# Patient Record
Sex: Female | Born: 1970 | Race: Black or African American | Hispanic: No | Marital: Single | Smoking: Never smoker
Health system: Southern US, Community
[De-identification: ages and names within clinical notes are randomized; demographics above are authoritative.]

## PROBLEM LIST (undated history)

## (undated) DIAGNOSIS — R509 Fever, unspecified: Secondary | ICD-10-CM

## (undated) DIAGNOSIS — J9601 Acute respiratory failure with hypoxia: Secondary | ICD-10-CM

## (undated) DIAGNOSIS — I428 Other cardiomyopathies: Secondary | ICD-10-CM

## (undated) DIAGNOSIS — I10 Essential (primary) hypertension: Secondary | ICD-10-CM

## (undated) DIAGNOSIS — N183 Chronic kidney disease, stage 3 (moderate): Secondary | ICD-10-CM

## (undated) DIAGNOSIS — I5041 Acute combined systolic (congestive) and diastolic (congestive) heart failure: Secondary | ICD-10-CM

## (undated) DIAGNOSIS — J9600 Acute respiratory failure, unspecified whether with hypoxia or hypercapnia: Secondary | ICD-10-CM

## (undated) DIAGNOSIS — R739 Hyperglycemia, unspecified: Secondary | ICD-10-CM

## (undated) DIAGNOSIS — N179 Acute kidney failure, unspecified: Secondary | ICD-10-CM

## (undated) DIAGNOSIS — I5042 Chronic combined systolic (congestive) and diastolic (congestive) heart failure: Secondary | ICD-10-CM

## (undated) DIAGNOSIS — F419 Anxiety disorder, unspecified: Secondary | ICD-10-CM

## (undated) DIAGNOSIS — I160 Hypertensive urgency: Secondary | ICD-10-CM

## (undated) DIAGNOSIS — J810 Acute pulmonary edema: Secondary | ICD-10-CM

## (undated) DIAGNOSIS — M797 Fibromyalgia: Secondary | ICD-10-CM

## (undated) HISTORY — DX: Chronic kidney disease, stage 3 (moderate): N18.3

## (undated) HISTORY — DX: Acute combined systolic (congestive) and diastolic (congestive) heart failure: I50.41

## (undated) HISTORY — DX: Fever, unspecified: R50.9

## (undated) HISTORY — DX: Fibromyalgia: M79.7

## (undated) HISTORY — DX: Other cardiomyopathies: I42.8

## (undated) HISTORY — DX: Hyperglycemia, unspecified: R73.9

## (undated) HISTORY — DX: Acute respiratory failure, unspecified whether with hypoxia or hypercapnia: J96.00

## (undated) HISTORY — DX: Acute pulmonary edema: J81.0

## (undated) HISTORY — DX: Acute respiratory failure with hypoxia: J96.01

## (undated) HISTORY — DX: Hypertensive urgency: I16.0

## (undated) HISTORY — DX: Chronic combined systolic (congestive) and diastolic (congestive) heart failure: I50.42

## (undated) HISTORY — DX: Acute kidney failure, unspecified: N17.9

## (undated) HISTORY — DX: Essential (primary) hypertension: I10

## (undated) HISTORY — DX: Anxiety disorder, unspecified: F41.9

---

## 1998-10-17 ENCOUNTER — Emergency Department (HOSPITAL_COMMUNITY): Admission: EM | Admit: 1998-10-17 | Discharge: 1998-10-17 | Payer: Self-pay | Admitting: Family Medicine

## 1998-10-19 ENCOUNTER — Encounter: Admission: RE | Admit: 1998-10-19 | Discharge: 1999-01-17 | Payer: Self-pay | Admitting: *Deleted

## 1998-10-23 ENCOUNTER — Encounter: Admission: RE | Admit: 1998-10-23 | Discharge: 1998-12-06 | Payer: Self-pay | Admitting: *Deleted

## 1998-10-28 ENCOUNTER — Encounter: Payer: Self-pay | Admitting: Emergency Medicine

## 1998-10-28 ENCOUNTER — Emergency Department (HOSPITAL_COMMUNITY): Admission: EM | Admit: 1998-10-28 | Discharge: 1998-10-28 | Payer: Self-pay | Admitting: Emergency Medicine

## 1998-10-31 ENCOUNTER — Encounter: Payer: Self-pay | Admitting: *Deleted

## 1998-10-31 ENCOUNTER — Ambulatory Visit (HOSPITAL_COMMUNITY): Admission: RE | Admit: 1998-10-31 | Discharge: 1998-10-31 | Payer: Self-pay | Admitting: *Deleted

## 1999-04-23 ENCOUNTER — Encounter: Payer: Self-pay | Admitting: Family Medicine

## 1999-04-23 ENCOUNTER — Ambulatory Visit (HOSPITAL_COMMUNITY): Admission: RE | Admit: 1999-04-23 | Discharge: 1999-04-23 | Payer: Self-pay | Admitting: Family Medicine

## 2017-05-27 ENCOUNTER — Encounter (HOSPITAL_COMMUNITY): Payer: Self-pay | Admitting: Emergency Medicine

## 2017-05-27 ENCOUNTER — Inpatient Hospital Stay (HOSPITAL_COMMUNITY)
Admission: EM | Admit: 2017-05-27 | Discharge: 2017-06-02 | DRG: 286 | Disposition: A | Discharge status: Home / Self Care | Payer: Self-pay | Attending: Internal Medicine | Admitting: Internal Medicine

## 2017-05-27 ENCOUNTER — Emergency Department (HOSPITAL_COMMUNITY): Payer: Self-pay

## 2017-05-27 DIAGNOSIS — J810 Acute pulmonary edema: Secondary | ICD-10-CM | POA: Diagnosis present

## 2017-05-27 DIAGNOSIS — J9600 Acute respiratory failure, unspecified whether with hypoxia or hypercapnia: Secondary | ICD-10-CM

## 2017-05-27 DIAGNOSIS — R509 Fever, unspecified: Secondary | ICD-10-CM

## 2017-05-27 DIAGNOSIS — E876 Hypokalemia: Secondary | ICD-10-CM | POA: Diagnosis present

## 2017-05-27 DIAGNOSIS — N179 Acute kidney failure, unspecified: Secondary | ICD-10-CM | POA: Diagnosis present

## 2017-05-27 DIAGNOSIS — J9601 Acute respiratory failure with hypoxia: Secondary | ICD-10-CM | POA: Diagnosis present

## 2017-05-27 DIAGNOSIS — I160 Hypertensive urgency: Secondary | ICD-10-CM | POA: Diagnosis present

## 2017-05-27 DIAGNOSIS — R0603 Acute respiratory distress: Secondary | ICD-10-CM

## 2017-05-27 DIAGNOSIS — I447 Left bundle-branch block, unspecified: Secondary | ICD-10-CM | POA: Diagnosis present

## 2017-05-27 DIAGNOSIS — Z885 Allergy status to narcotic agent status: Secondary | ICD-10-CM

## 2017-05-27 DIAGNOSIS — I161 Hypertensive emergency: Secondary | ICD-10-CM | POA: Diagnosis present

## 2017-05-27 DIAGNOSIS — I429 Cardiomyopathy, unspecified: Secondary | ICD-10-CM | POA: Diagnosis present

## 2017-05-27 DIAGNOSIS — I110 Hypertensive heart disease with heart failure: Principal | ICD-10-CM | POA: Diagnosis present

## 2017-05-27 DIAGNOSIS — I5041 Acute combined systolic (congestive) and diastolic (congestive) heart failure: Secondary | ICD-10-CM | POA: Diagnosis present

## 2017-05-27 DIAGNOSIS — W19XXXA Unspecified fall, initial encounter: Secondary | ICD-10-CM

## 2017-05-27 DIAGNOSIS — Z8249 Family history of ischemic heart disease and other diseases of the circulatory system: Secondary | ICD-10-CM

## 2017-05-27 DIAGNOSIS — R739 Hyperglycemia, unspecified: Secondary | ICD-10-CM

## 2017-05-27 DIAGNOSIS — E1165 Type 2 diabetes mellitus with hyperglycemia: Secondary | ICD-10-CM | POA: Diagnosis present

## 2017-05-27 DIAGNOSIS — Z79899 Other long term (current) drug therapy: Secondary | ICD-10-CM

## 2017-05-27 DIAGNOSIS — E872 Acidosis: Secondary | ICD-10-CM | POA: Diagnosis present

## 2017-05-27 DIAGNOSIS — R000 Tachycardia, unspecified: Secondary | ICD-10-CM | POA: Diagnosis present

## 2017-05-27 DIAGNOSIS — D649 Anemia, unspecified: Secondary | ICD-10-CM | POA: Diagnosis present

## 2017-05-27 DIAGNOSIS — J189 Pneumonia, unspecified organism: Secondary | ICD-10-CM | POA: Diagnosis present

## 2017-05-27 DIAGNOSIS — F419 Anxiety disorder, unspecified: Secondary | ICD-10-CM | POA: Diagnosis present

## 2017-05-27 DIAGNOSIS — Z833 Family history of diabetes mellitus: Secondary | ICD-10-CM

## 2017-05-27 HISTORY — DX: Acute respiratory failure with hypoxia: J96.01

## 2017-05-27 HISTORY — DX: Hypertensive urgency: I16.0

## 2017-05-27 HISTORY — DX: Essential (primary) hypertension: I10

## 2017-05-27 HISTORY — DX: Anxiety disorder, unspecified: F41.9

## 2017-05-27 HISTORY — DX: Acute kidney failure, unspecified: N17.9

## 2017-05-27 HISTORY — DX: Fever, unspecified: R50.9

## 2017-05-27 HISTORY — DX: Hyperglycemia, unspecified: R73.9

## 2017-05-27 HISTORY — DX: Acute pulmonary edema: J81.0

## 2017-05-27 HISTORY — DX: Acute respiratory failure, unspecified whether with hypoxia or hypercapnia: J96.00

## 2017-05-27 LAB — COMPREHENSIVE METABOLIC PANEL
ALBUMIN: 3.3 g/dL — AB (ref 3.5–5.0)
ALT: 51 U/L (ref 14–54)
ANION GAP: 16 — AB (ref 5–15)
AST: 55 U/L — AB (ref 15–41)
Alkaline Phosphatase: 83 U/L (ref 38–126)
BUN: 10 mg/dL (ref 6–20)
CHLORIDE: 105 mmol/L (ref 101–111)
CO2: 16 mmol/L — AB (ref 22–32)
Calcium: 8.6 mg/dL — ABNORMAL LOW (ref 8.9–10.3)
Creatinine, Ser: 1.63 mg/dL — ABNORMAL HIGH (ref 0.44–1.00)
GFR calc Af Amer: 42 mL/min — ABNORMAL LOW (ref >60)
GFR calc non Af Amer: 36 mL/min — ABNORMAL LOW (ref >60)
GLUCOSE: 337 mg/dL — AB (ref 65–99)
POTASSIUM: 3.7 mmol/L (ref 3.5–5.1)
SODIUM: 137 mmol/L (ref 135–145)
Total Bilirubin: 1.3 mg/dL — ABNORMAL HIGH (ref 0.3–1.2)
Total Protein: 6.1 g/dL — ABNORMAL LOW (ref 6.5–8.1)

## 2017-05-27 LAB — CBC WITH DIFFERENTIAL/PLATELET
BASOS ABS: 0 10*3/uL (ref 0.0–0.1)
Basophils Relative: 0 %
EOS ABS: 0.3 10*3/uL (ref 0.0–0.7)
Eosinophils Relative: 2 %
HCT: 36.1 % (ref 36.0–46.0)
Hemoglobin: 11.5 g/dL — ABNORMAL LOW (ref 12.0–15.0)
LYMPHS ABS: 6.8 10*3/uL — AB (ref 0.7–4.0)
Lymphocytes Relative: 40 %
MCH: 29.1 pg (ref 26.0–34.0)
MCHC: 31.9 g/dL (ref 30.0–36.0)
MCV: 91.4 fL (ref 78.0–100.0)
MONO ABS: 0.7 10*3/uL (ref 0.1–1.0)
Monocytes Relative: 4 %
NEUTROS ABS: 9.1 10*3/uL — AB (ref 1.7–7.7)
Neutrophils Relative %: 54 %
PLATELETS: 460 10*3/uL — AB (ref 150–400)
RBC: 3.95 MIL/uL (ref 3.87–5.11)
RDW: 14.1 % (ref 11.5–15.5)
WBC: 16.9 10*3/uL — AB (ref 4.0–10.5)

## 2017-05-27 LAB — I-STAT BETA HCG BLOOD, ED (MC, WL, AP ONLY): I-stat hCG, quantitative: <5 m[IU]/mL (ref <5)

## 2017-05-27 LAB — URINALYSIS, ROUTINE W REFLEX MICROSCOPIC
Bilirubin Urine: NEGATIVE
GLUCOSE, UA: 150 mg/dL — AB
Ketones, ur: NEGATIVE mg/dL
LEUKOCYTES UA: NEGATIVE
NITRITE: NEGATIVE
Protein, ur: 100 mg/dL — AB
SPECIFIC GRAVITY, URINE: 1.008 (ref 1.005–1.030)
pH: 6 (ref 5.0–8.0)

## 2017-05-27 LAB — I-STAT TROPONIN, ED
Troponin i, poc: 0.06 ng/mL (ref 0.00–0.08)
Troponin i, poc: 0.14 ng/mL (ref 0.00–0.08)

## 2017-05-27 LAB — RAPID URINE DRUG SCREEN, HOSP PERFORMED
AMPHETAMINES: NOT DETECTED
Barbiturates: NOT DETECTED
Benzodiazepines: NOT DETECTED
COCAINE: NOT DETECTED
OPIATES: NOT DETECTED
Tetrahydrocannabinol: NOT DETECTED

## 2017-05-27 LAB — I-STAT VENOUS BLOOD GAS, ED
Acid-base deficit: 6 mmol/L — ABNORMAL HIGH (ref 0.0–2.0)
BICARBONATE: 20.4 mmol/L (ref 20.0–28.0)
O2 Saturation: 93 %
PCO2 VEN: 42.4 mmHg — AB (ref 44.0–60.0)
PH VEN: 7.291 (ref 7.250–7.430)
TCO2: 22 mmol/L (ref 0–100)
pO2, Ven: 74 mmHg — ABNORMAL HIGH (ref 32.0–45.0)

## 2017-05-27 LAB — I-STAT CG4 LACTIC ACID, ED
LACTIC ACID, VENOUS: 2.17 mmol/L — AB (ref 0.5–1.9)
Lactic Acid, Venous: 3.97 mmol/L (ref 0.5–1.9)

## 2017-05-27 LAB — BRAIN NATRIURETIC PEPTIDE: B NATRIURETIC PEPTIDE 5: 1729.7 pg/mL — AB (ref 0.0–100.0)

## 2017-05-27 LAB — TSH: TSH: 1.205 u[IU]/mL (ref 0.350–4.500)

## 2017-05-27 MED ORDER — ONDANSETRON HCL 4 MG PO TABS: 4.0000 mg | ORAL_TABLET | Freq: Four times a day (QID) | ORAL | Status: DC | PRN

## 2017-05-27 MED ORDER — VENLAFAXINE HCL ER 75 MG PO CP24
150.0000 mg | ORAL_CAPSULE | Freq: Every day | ORAL | Status: DC
  Administered 2017-05-28 – 2017-06-02 (×6): 150 mg via ORAL
  Filled 2017-05-27: qty 1
  Filled 2017-05-27 (×6): qty 2

## 2017-05-27 MED ORDER — ACETAMINOPHEN 325 MG PO TABS
650.0000 mg | ORAL_TABLET | Freq: Four times a day (QID) | ORAL | Status: DC | PRN
  Administered 2017-05-28 (×2): 650 mg via ORAL
  Filled 2017-05-27 (×2): qty 2

## 2017-05-27 MED ORDER — NITROGLYCERIN IN D5W 200-5 MCG/ML-% IV SOLN
0.0000 ug/min | Freq: Once | INTRAVENOUS | Status: AC
  Administered 2017-05-27: 5 ug/min via INTRAVENOUS
  Filled 2017-05-27: qty 250

## 2017-05-27 MED ORDER — FUROSEMIDE 10 MG/ML IJ SOLN
40.0000 mg | Freq: Two times a day (BID) | INTRAMUSCULAR | Status: DC
  Administered 2017-05-28 – 2017-05-31 (×7): 40 mg via INTRAVENOUS
  Filled 2017-05-27 (×7): qty 4

## 2017-05-27 MED ORDER — FUROSEMIDE 10 MG/ML IJ SOLN
60.0000 mg | Freq: Once | INTRAMUSCULAR | Status: AC
  Administered 2017-05-27: 60 mg via INTRAVENOUS
  Filled 2017-05-27: qty 6

## 2017-05-27 MED ORDER — INSULIN ASPART 100 UNIT/ML ~~LOC~~ SOLN
0.0000 [IU] | Freq: Three times a day (TID) | SUBCUTANEOUS | Status: DC
  Administered 2017-05-30: 1 [IU] via SUBCUTANEOUS

## 2017-05-27 MED ORDER — DEXTROSE 5 % IV SOLN
500.0000 mg | INTRAVENOUS | Status: DC
  Administered 2017-05-28: 500 mg via INTRAVENOUS
  Filled 2017-05-27 (×3): qty 500

## 2017-05-27 MED ORDER — DEXTROSE 5 % IV SOLN
1.0000 g | INTRAVENOUS | Status: DC
  Administered 2017-05-28 – 2017-05-31 (×4): 1 g via INTRAVENOUS
  Filled 2017-05-27 (×5): qty 10

## 2017-05-27 MED ORDER — NITROGLYCERIN IN D5W 200-5 MCG/ML-% IV SOLN: 0.0000 ug/min | INTRAVENOUS | Status: DC

## 2017-05-27 MED ORDER — LORAZEPAM 2 MG/ML IJ SOLN
0.5000 mg | Freq: Once | INTRAMUSCULAR | Status: AC
  Administered 2017-05-27: 0.5 mg via INTRAVENOUS
  Filled 2017-05-27: qty 1

## 2017-05-27 MED ORDER — ACETAMINOPHEN 650 MG RE SUPP: 650.0000 mg | Freq: Four times a day (QID) | RECTAL | Status: DC | PRN

## 2017-05-27 MED ORDER — ENOXAPARIN SODIUM 40 MG/0.4ML ~~LOC~~ SOLN
40.0000 mg | SUBCUTANEOUS | Status: DC
  Administered 2017-05-28 – 2017-06-02 (×5): 40 mg via SUBCUTANEOUS
  Filled 2017-05-27 (×6): qty 0.4

## 2017-05-27 MED ORDER — NITROGLYCERIN 0.4 MG SL SUBL: 0.4000 mg | SUBLINGUAL_TABLET | SUBLINGUAL | Status: DC | PRN

## 2017-05-27 MED ORDER — DEXTROSE 5 % IV SOLN
1.0000 g | Freq: Once | INTRAVENOUS | Status: AC
  Administered 2017-05-27: 1 g via INTRAVENOUS
  Filled 2017-05-27: qty 10

## 2017-05-27 MED ORDER — AZITHROMYCIN 500 MG IV SOLR
500.0000 mg | Freq: Once | INTRAVENOUS | Status: AC
  Administered 2017-05-27: 500 mg via INTRAVENOUS
  Filled 2017-05-27: qty 500

## 2017-05-27 MED ORDER — AMLODIPINE BESYLATE 5 MG PO TABS: 5.0000 mg | ORAL_TABLET | Freq: Every day | ORAL | Status: DC

## 2017-05-27 MED ORDER — ONDANSETRON HCL 4 MG/2ML IJ SOLN: 4.0000 mg | Freq: Four times a day (QID) | INTRAMUSCULAR | Status: DC | PRN

## 2017-05-27 MED ORDER — GABAPENTIN 300 MG PO CAPS
300.0000 mg | ORAL_CAPSULE | Freq: Every day | ORAL | Status: DC
  Administered 2017-05-27 – 2017-05-28 (×2): 300 mg via ORAL
  Filled 2017-05-27 (×2): qty 1

## 2017-05-27 NOTE — ED Provider Notes (Signed)
[facility]-EMERGENCY DEPT Provider Note   CSN: [id number] Arrival date & time: 05/27/17  1027     History   Chief Complaint Chief Complaint  Patient presents with  . Shortness of Breath    HPI Lindsey Singh is a 46 y.o. female.  HPI   46 year old female with a history of anxiety and hypertension presents with concern for acute onset of shortness of breath. Patient reports she's had cough over the last several weeks that have been worsening, and today she was sitting in the parking deck in the hot car prior to the fireworks, became acutely short of breath. Reports she was just sitting in the car when it happened. Denies any chest pain, fevers. Denies history of respiratory disease, including no COPD, asthma, or history of smoking. Denies other drug use. Denies any new medications, discontinuation of medications, alcohol use or discontinuation of alcohol. Reports she took her blood pressure medications today.  She arrives in acute respiratory distress from EMS, on nonrebreather. They report she was hypoxic to low 80s on room air and blood pressures were 252/159.   Past Medical History:  Diagnosis Date  . Anxiety   . Hypertension     Patient Active Problem List   Diagnosis Date Noted  . Acute respiratory failure with hypoxia (HCC) 05/27/2017  . Acute pulmonary edema (HCC) 05/27/2017  . Hypertensive urgency 05/27/2017  . Acute renal failure (ARF) (HCC) 05/27/2017  . Fever 05/27/2017  . Hyperglycemia 05/27/2017  . Acute respiratory failure (HCC) 05/27/2017    History reviewed. No pertinent surgical history.  OB History    No data available       Home Medications    Prior to Admission medications   Medication Sig Start Date End Date Taking? Authorizing Provider  AMLODIPINE BESYLATE PO Take 1 tablet by mouth daily.   Yes [provider]  bisoprolol-hydrochlorothiazide (ZIAC) 5-6.25 MG tablet Take 1 tablet by mouth daily.   Yes [provider]    Cyanocobalamin (VITAMIN B-12 PO) Take 1 tablet by mouth daily.   Yes [provider]  gabapentin (NEURONTIN) 300 MG capsule Take 300-600 mg by mouth at bedtime.   Yes [provider]  guaiFENesin-dextromethorphan (ROBITUSSIN DM) 100-10 MG/5ML syrup Take 5 mLs by mouth every 4 (four) hours as needed for cough.   Yes [provider]  meloxicam (MOBIC) 7.5 MG tablet Take 7.5 mg by mouth 2 (two) times daily as needed for pain.   Yes [provider]  traMADol (ULTRAM) 50 MG tablet Take 50 mg by mouth every 6 (six) hours as needed for moderate pain.   Yes [provider]  valsartan-hydrochlorothiazide (DIOVAN-HCT) 160-12.5 MG tablet Take 1 tablet by mouth daily.   Yes [provider]  venlafaxine XR (EFFEXOR-XR) 150 MG 24 hr capsule Take 150 mg by mouth daily with breakfast.   Yes [provider]    Family History Family History  Problem Relation Age of Onset  . Diabetes Mellitus II Mother   . Hypertension Mother   . Hypertension Father     Social History Social History  Substance Use Topics  . Smoking status: Never Smoker  . Smokeless tobacco: Never Used  . Alcohol use No     Allergies   Morphine and related   Review of Systems Review of Systems  Constitutional: Positive for diaphoresis. Negative for fever.  HENT: Negative for sore throat.   Eyes: Negative for visual disturbance.  Respiratory: Positive for cough and shortness of breath.  Cardiovascular: Negative for chest pain and leg swelling.  Gastrointestinal: Negative for abdominal pain, nausea and vomiting.  Genitourinary: Negative for difficulty urinating.  Musculoskeletal: Negative for back pain and neck pain.  Skin: Negative for rash.  Neurological: Negative for syncope and headaches.     Physical Exam Updated Vital Signs BP (!) 145/94   Pulse 91   Temp (!) 100.8 F (38.2 C) (Rectal)   Resp (!) 25   SpO2 100%   Physical Exam  Constitutional:  She is oriented to person, place, and time. She appears well-developed and well-nourished. She has a sickly appearance. She appears ill. She appears distressed (anxious).  HENT:  Head: Normocephalic and atraumatic.  Eyes: Conjunctivae and EOM are normal.  Neck: Normal range of motion.  Cardiovascular: Normal rate, regular rhythm, normal heart sounds and intact distal pulses.  Exam reveals no gallop and no friction rub.   No murmur heard. Pulmonary/Chest: Accessory muscle usage present. Tachypnea noted. She is in respiratory distress. She has decreased breath sounds (mild at bases). She has no wheezes. She has no rales.  Abdominal: Soft. She exhibits no distension. There is no tenderness. There is no guarding.  Musculoskeletal: She exhibits no edema or tenderness.  Neurological: She is alert and oriented to person, place, and time.  Skin: Skin is warm and dry. No rash noted. She is not diaphoretic. No erythema.  Nursing note and vitals reviewed.    ED Treatments / Results  Labs (all labs ordered are listed, but only abnormal results are displayed) Labs Reviewed  CBC WITH DIFFERENTIAL/PLATELET - Abnormal; Notable for the following:       Result Value   WBC 16.9 (*)    Hemoglobin 11.5 (*)    Platelets 460 (*)    Neutro Abs 9.1 (*)    Lymphs Abs 6.8 (*)    All other components within normal limits  COMPREHENSIVE METABOLIC PANEL - Abnormal; Notable for the following:    CO2 16 (*)    Glucose, Bld 337 (*)    Creatinine, Ser 1.63 (*)    Calcium 8.6 (*)    Total Protein 6.1 (*)    Albumin 3.3 (*)    AST 55 (*)    Total Bilirubin 1.3 (*)    GFR calc non Af Amer 36 (*)    GFR calc Af Amer 42 (*)    Anion gap 16 (*)    All other components within normal limits  BRAIN NATRIURETIC PEPTIDE - Abnormal; Notable for the following:    B Natriuretic Peptide 1,729.7 (*)    All other components within normal limits  URINALYSIS, ROUTINE W REFLEX MICROSCOPIC - Abnormal; Notable for the  following:    Color, Urine STRAW (*)    Glucose, UA 150 (*)    Hgb urine dipstick SMALL (*)    Protein, ur 100 (*)    Bacteria, UA RARE (*)    Squamous Epithelial / LPF 0-5 (*)    All other components within normal limits  I-STAT VENOUS BLOOD GAS, ED - Abnormal; Notable for the following:    pCO2, Ven 42.4 (*)    pO2, Ven 74.0 (*)    Acid-base deficit 6.0 (*)    All other components within normal limits  I-STAT CG4 LACTIC ACID, ED - Abnormal; Notable for the following:    Lactic Acid, Venous 3.97 (*)    All other components within normal limits  I-STAT TROPOININ, ED - Abnormal; Notable for the following:    Troponin i, poc  0.14 (*)    All other components within normal limits  I-STAT CG4 LACTIC ACID, ED - Abnormal; Notable for the following:    Lactic Acid, Venous 2.17 (*)    All other components within normal limits  CULTURE, BLOOD (ROUTINE X 2)  CULTURE, BLOOD (ROUTINE X 2)  CULTURE, EXPECTORATED SPUTUM-ASSESSMENT  GRAM STAIN  RAPID URINE DRUG SCREEN, HOSP PERFORMED  TSH  TROPONIN I  TROPONIN I  TROPONIN I  BASIC METABOLIC PANEL  HIV ANTIBODY (ROUTINE TESTING)  CBC  HEMOGLOBIN A1C  MAGNESIUM  CBC  CREATININE, SERUM  STREP PNEUMONIAE URINARY ANTIGEN  PROCALCITONIN  LEGIONELLA PNEUMOPHILA SEROGP 1 UR AG  I-STAT TROPOININ, ED  I-STAT BETA HCG BLOOD, ED ([facility], [facility], [facility] ONLY)    EKG  EKG Interpretation  Date/Time:  Wednesday May 27 2017 19:17:17 EDT Ventricular Rate:  141 PR Interval:    QRS Duration: 128 QT Interval:  388 QTC Calculation: 595 R Axis:   -40 Text Interpretation:  Sinus tachycardia Left bundle branch block No previous ECGs available Confirmed by Alvira Monday ([id number]) on 05/27/2017 7:31:43 PM       Radiology Dg Chest Portable 1 View  Result Date: 05/27/2017 CLINICAL DATA:  Sudden onset shortness of breath. EXAM: PORTABLE CHEST 1 VIEW COMPARISON:  None. FINDINGS: 1934 hours. Cardiopericardial silhouette is at upper limits of normal for size.  Vascular congestion with probable interstitial pulmonary edema. Small bilateral pleural effusions suspected. The visualized bony structures of the thorax are intact. Telemetry leads overlie the chest. IMPRESSION: Low volume film with borderline to mild cardiomegaly, interstitial pulmonary edema pattern and small bilateral pleural effusions. Electronically Signed   By: Kennith Center M.D.   On: 05/27/2017 19:49    Procedures Procedures (including critical care time)  Medications Ordered in ED Medications  amLODipine (NORVASC) tablet 5 mg (not administered)  gabapentin (NEURONTIN) capsule 300-600 mg (300 mg Oral Given 05/27/17 2314)  venlafaxine XR (EFFEXOR-XR) 24 hr capsule 150 mg (not administered)  nitroGLYCERIN 50 mg in dextrose 5 % 250 mL (0.2 mg/mL) infusion (not administered)  acetaminophen (TYLENOL) tablet 650 mg (not administered)    Or  acetaminophen (TYLENOL) suppository 650 mg (not administered)  ondansetron (ZOFRAN) tablet 4 mg (not administered)    Or  ondansetron (ZOFRAN) injection 4 mg (not administered)  insulin aspart (novoLOG) injection 0-9 Units (not administered)  furosemide (LASIX) injection 40 mg (not administered)  enoxaparin (LOVENOX) injection 40 mg (not administered)  cefTRIAXone (ROCEPHIN) 1 g in dextrose 5 % 50 mL IVPB (not administered)  azithromycin (ZITHROMAX) 500 mg in dextrose 5 % 250 mL IVPB (not administered)  LORazepam (ATIVAN) injection 0.5 mg (0.5 mg Intravenous Given 05/27/17 1953)  cefTRIAXone (ROCEPHIN) 1 g in dextrose 5 % 50 mL IVPB (0 g Intravenous Stopped 05/27/17 2051)  azithromycin (ZITHROMAX) 500 mg in dextrose 5 % 250 mL IVPB (0 mg Intravenous Stopped 05/27/17 2204)  furosemide (LASIX) injection 60 mg (60 mg Intravenous Given 05/27/17 2119)  nitroGLYCERIN 50 mg in dextrose 5 % 250 mL (0.2 mg/mL) infusion (15 mcg/min Intravenous Rate/Dose Change 05/28/17 0028)   CRITICAL CARE: acute respiratory distress, htn emergency Performed by: Rhae Lerner   Total critical care time: 30 minutes  Critical care time was exclusive of separately billable procedures and treating other patients.  Critical care was necessary to treat or prevent imminent or life-threatening deterioration.  Critical care was time spent personally by me on the following activities: development of treatment plan with patient and/or surrogate as well as  nursing, discussions with consultants, evaluation of patient's response to treatment, examination of patient, obtaining history from patient or surrogate, ordering and performing treatments and interventions, ordering and review of laboratory studies, ordering and review of radiographic studies, pulse oximetry and re-evaluation of patient's condition.   Initial Impression / Assessment and Plan / ED Course  I have reviewed the triage vital signs and the nursing notes.  Pertinent labs & imaging results that were available during my care of the patient were reviewed by me and considered in my medical decision making (see chart for details).     46 year old female with a history of hypertension and anxiety presents with acute onset of shortness of breath which began while she was sitting in a warm car in the parking lot. Patient hypoxic with EMS to 84, and respiratory distress, HR 140s, and blood pressures 250s over 150s.  On arrival, patient with noted hypoxia on room air, respiratory distress, continuing tachycardia and hypertension. Chest x-ray was done which showed signs of acute pulmonary edema and bilateral effusions.    History, chest x-ray, and BNP suggest acute congestive heart failure/pulmonary edema in the setting of hypertensive emergency.  She was given nitroglycerin sublingual, and placed on BiPAP with improvement of her blood pressures and respiratory distress.  Suspect this is most likely etiology of symptoms. Placed on nitro gtt and given lasix.   Rectal temperature was 100.8, given history of cough for  1 month, covered with Rocephin and azithromycin for community-acquired pneumonia, however by history feel hypertensive emergency and elevated temperature related to environment/sitting in warm car is possible.  PE is on differential, however given presentation of extreme hypertension, diffuse interstitial findings on XR, hx of pt being in warm car as possible explanation of temperature, improvement with nitro/bipap, feel PE is less likely and can continue to monitor patient closely as an inpatient and consider if she does not continue to improve.   Patient admitted to Dr. Toniann Fail for continued care.   Final Clinical Impressions(s) / ED Diagnoses   Final diagnoses:  Hypertensive emergency  Acute pulmonary edema (HCC)  Respiratory distress    New Prescriptions New Prescriptions   No medications on file     Alvira Monday, MD 05/28/17 [phone]

## 2017-05-27 NOTE — H&P (Signed)
History and Physical    Lindsey Singh ZJI:[id number] DOB: Oct 20, 1971 DOA: 05/27/2017  PCP: Patient, No Pcp Per  Patient coming from: Home.  Chief Complaint: Shortness of breath.  HPI: Lindsey Singh is a 46 y.o. female with history of hypertension who is originally from [place] visiting her sister started developing shortness of breath today while trying to visit the [place] 4 parade. Patient was in the car and was brought straight to the ER. Denies any chest pain. Patient has been having productive cough for last 3 weeks. Denies any sick contacts or any other travel other than coming to [place]. Patient states she is on 3 medications for blood pressure and has not missed her doses.   ED Course: In the ER patient was found to be markedly hypertensive with blood pressure more than 200 systolic. Chest x-ray was showing congestion concerning for CHF. EKG was showing sinus tachycardia with LBBB. BNP was 1700. Patient also had fever with leukocytosis. Patient was started on Lasix 60 mg IV and placed on BiPAP and since blood pressure was not improving started on nitroglycerin infusion. Antibiotics for possible pneumonia.  Review of Systems: As per HPI, rest all negative.   Past Medical History:  Diagnosis Date  . Anxiety   . Hypertension     History reviewed. No pertinent surgical history.   reports that she has never smoked. She has never used smokeless tobacco. She reports that she does not drink alcohol or use drugs.  Allergies  Allergen Reactions  . Morphine And Related Swelling    Family History  Problem Relation Age of Onset  . Diabetes Mellitus II Mother   . Hypertension Mother   . Hypertension Father     Prior to Admission medications   Medication Sig Start Date End Date Taking? Authorizing Provider  AMLODIPINE BESYLATE PO Take 1 tablet by mouth daily.   Yes [provider]  bisoprolol-hydrochlorothiazide (ZIAC) 5-6.25 MG tablet Take 1 tablet by  mouth daily.   Yes [provider]  Cyanocobalamin (VITAMIN B-12 PO) Take 1 tablet by mouth daily.   Yes [provider]  gabapentin (NEURONTIN) 300 MG capsule Take 300-600 mg by mouth at bedtime.   Yes [provider]  guaiFENesin-dextromethorphan (ROBITUSSIN DM) 100-10 MG/5ML syrup Take 5 mLs by mouth every 4 (four) hours as needed for cough.   Yes [provider]  meloxicam (MOBIC) 7.5 MG tablet Take 7.5 mg by mouth 2 (two) times daily as needed for pain.   Yes [provider]  traMADol (ULTRAM) 50 MG tablet Take 50 mg by mouth every 6 (six) hours as needed for moderate pain.   Yes [provider]  valsartan-hydrochlorothiazide (DIOVAN-HCT) 160-12.5 MG tablet Take 1 tablet by mouth daily.   Yes [provider]  venlafaxine XR (EFFEXOR-XR) 150 MG 24 hr capsule Take 150 mg by mouth daily with breakfast.   Yes [provider]    Physical Exam: Vitals:   05/27/17 2130 05/27/17 2156 05/27/17 2200 05/27/17 2215  BP: (!) 178/113  (!) 170/120 (!) 165/117  Pulse:  95    Resp: (!) 23 (!) 21 (!) 33 14  Temp:      TempSrc:      SpO2:  100%        Constitutional: Moderately built and nourished. Vitals:   05/27/17 2130 05/27/17 2156 05/27/17 2200 05/27/17 2215  BP: (!) 178/113  (!) 170/120 (!) 165/117  Pulse:  95    Resp: Marland Kitchen)  23 (!) 21 (!) 33 14  Temp:      TempSrc:      SpO2:  100%     Eyes: Anicteric no pallor. ENMT: No discharge from the ears eyes nose and mouth. Neck: No JVD appreciated no mass felt. Respiratory: Mild crepitation bilaterally. No rhonchi. Cardiovascular: S1-S2 heard no murmurs appreciated. Abdomen: Soft nontender bowel sounds present. Musculoskeletal: No edema. No joint effusion. Skin: No rash. Skin appears warm. Neurologic: Alert awake oriented to time place and person. Moves all extremities. Psychiatric: Appears normal. Normal affect.   Labs on Admission: I have personally reviewed  following labs and imaging studies  CBC:  Recent Labs Lab 05/27/17 1935  WBC 16.9*  NEUTROABS 9.1*  HGB 11.5*  HCT 36.1  MCV 91.4  PLT 460*   Basic Metabolic Panel:  Recent Labs Lab 05/27/17 1935  NA 137  K 3.7  CL 105  CO2 16*  GLUCOSE 337*  BUN 10  CREATININE 1.63*  CALCIUM 8.6*   GFR: CrCl cannot be calculated (Unknown ideal weight.). Liver Function Tests:  Recent Labs Lab 05/27/17 1935  AST 55*  ALT 51  ALKPHOS 83  BILITOT 1.3*  PROT 6.1*  ALBUMIN 3.3*   No results for input(s): LIPASE, AMYLASE in the last 168 hours. No results for input(s): AMMONIA in the last 168 hours. Coagulation Profile: No results for input(s): INR, PROTIME in the last 168 hours. Cardiac Enzymes: No results for input(s): CKTOTAL, CKMB, CKMBINDEX, TROPONINI in the last 168 hours. BNP (last 3 results) No results for input(s): PROBNP in the last 8760 hours. HbA1C: No results for input(s): HGBA1C in the last 72 hours. CBG: No results for input(s): GLUCAP in the last 168 hours. Lipid Profile: No results for input(s): CHOL, HDL, LDLCALC, TRIG, CHOLHDL, LDLDIRECT in the last 72 hours. Thyroid Function Tests:  Recent Labs  05/27/17 2038  TSH 1.205   Anemia Panel: No results for input(s): VITAMINB12, FOLATE, FERRITIN, TIBC, IRON, RETICCTPCT in the last 72 hours. Urine analysis:    Component Value Date/Time   COLORURINE STRAW (A) 05/27/2017 2106   APPEARANCEUR CLEAR 05/27/2017 2106   LABSPEC 1.008 05/27/2017 2106   PHURINE 6.0 05/27/2017 2106   GLUCOSEU 150 (A) 05/27/2017 2106   HGBUR SMALL (A) 05/27/2017 2106   BILIRUBINUR NEGATIVE 05/27/2017 2106   KETONESUR NEGATIVE 05/27/2017 2106   PROTEINUR 100 (A) 05/27/2017 2106   NITRITE NEGATIVE 05/27/2017 2106   LEUKOCYTESUR NEGATIVE 05/27/2017 2106   Sepsis Labs: @LABRCNTIP (procalcitonin:4,lacticidven:4) )No results found for this or any previous visit (from the past 240 hour(s)).   Radiological Exams on Admission: Dg  Chest Portable 1 View  Result Date: 05/27/2017 CLINICAL DATA:  Sudden onset shortness of breath. EXAM: PORTABLE CHEST 1 VIEW COMPARISON:  None. FINDINGS: 1934 hours. Cardiopericardial silhouette is at upper limits of normal for size. Vascular congestion with probable interstitial pulmonary edema. Small bilateral pleural effusions suspected. The visualized bony structures of the thorax are intact. Telemetry leads overlie the chest. IMPRESSION: Low volume film with borderline to mild cardiomegaly, interstitial pulmonary edema pattern and small bilateral pleural effusions. Electronically Signed   By: Kennith Center M.D.   On: 05/27/2017 19:49    EKG: Independently reviewed. Sinus tachycardia with LBBB.  Assessment/Plan Principal Problem:   Acute respiratory failure with hypoxia (HCC) Active Problems:   Acute pulmonary edema (HCC)   Hypertensive urgency   Acute renal failure (ARF) (HCC)   Fever   Hyperglycemia   Acute respiratory failure (HCC)    1. Acute  respiratory failure with hypoxia secondary to pulmonary edema and pneumonia - patient has been placed on Lasix 60 mg IV 1 dose followed by 40 mg IV every 12. Patient is on nitroglycerin infusion. Cycle cardiac markers check 2-D echo following day and output and metabolic panel daily weights. Consult cardiology in a.m. Not on ACE inhibitor or ARB secondary to renal failure. 2. Possible pneumonia - patient was febrile tachycardic. Patient also has been having productive cough. Patient is placed on ceftriaxone and Zithromax for community-acquired pneumonia. Check blood cultures sputum cultures urine for Legionella and strep antigen. Check procalcitonin levels. 3. Hypertensive urgency - patient is on nitroglycerin infusion and I have continued patient's Norvasc. Patient likely was on ACE inhibitor and hydrochlorothiazide which I'm holding for now since patient is on Lasix and also has renal failure. 4. Hyperglycemia - likely new onset diabetes. Check  hemoglobin A1c. An eye on His mildly elevated likely from lactic acidosis. If metabolic panel remains today show persistent elevated anion gap may need IV insulin. For now I have placed patient on sliding scale coverage. 5. Anemia normocytic normochromic - check anemia panel. 6. Acute renal failure - no old labs to compare. Platelets are normal. Closely observe since patient is receiving Lasix.   DVT prophylaxis: Lovenox. Code Status: Full code.  Family Communication: Patient's sister and dad.  Disposition Plan: Home.  Consults called: None.  Admission status: Inpatient.    Eduard Clos MD [facility] Pager [phone].  If 7PM-7AM, please contact night-coverage [email or web address] Password [email or web address]  05/27/2017, 10:38 PM

## 2017-05-27 NOTE — ED Notes (Signed)
Pt given 1 NTG verbal order EDP.

## 2017-05-27 NOTE — ED Triage Notes (Signed)
Per EMS, pt sitting in car with sudden onset shortness of breath and anxiety. LS clear, 84% on room air, pt placed on NRB, SpO2-99% on 10L. EMS vitals: BP-252/159, HR-140, RR-30

## 2017-05-27 NOTE — ED Notes (Signed)
1 NTG given per verbal order EDP. 

## 2017-05-27 NOTE — ED Notes (Signed)
1 NTG given per verbal order EDP.

## 2017-05-28 ENCOUNTER — Other Ambulatory Visit (HOSPITAL_COMMUNITY): Payer: Self-pay

## 2017-05-28 DIAGNOSIS — J9601 Acute respiratory failure with hypoxia: Secondary | ICD-10-CM

## 2017-05-28 DIAGNOSIS — N171 Acute kidney failure with acute cortical necrosis: Secondary | ICD-10-CM

## 2017-05-28 LAB — CBC
HCT: 37.4 % (ref 36.0–46.0)
HCT: 38.3 % (ref 36.0–46.0)
Hemoglobin: 12.3 g/dL (ref 12.0–15.0)
Hemoglobin: 12.5 g/dL (ref 12.0–15.0)
MCH: 29.1 pg (ref 26.0–34.0)
MCH: 28.9 pg (ref 26.0–34.0)
MCHC: 32.9 g/dL (ref 30.0–36.0)
MCHC: 32.6 g/dL (ref 30.0–36.0)
MCV: 88.4 fL (ref 78.0–100.0)
MCV: 88.7 fL (ref 78.0–100.0)
PLATELETS: 415 10*3/uL — AB (ref 150–400)
PLATELETS: 430 10*3/uL — AB (ref 150–400)
RBC: 4.23 MIL/uL (ref 3.87–5.11)
RBC: 4.32 MIL/uL (ref 3.87–5.11)
RDW: 14 % (ref 11.5–15.5)
RDW: 14 % (ref 11.5–15.5)
WBC: 19.9 10*3/uL — ABNORMAL HIGH (ref 4.0–10.5)
WBC: 17.2 10*3/uL — AB (ref 4.0–10.5)

## 2017-05-28 LAB — TROPONIN I
TROPONIN I: 0.24 ng/mL — AB (ref <0.03)
TROPONIN I: 0.22 ng/mL — AB (ref <0.03)
Troponin I: 0.17 ng/mL (ref <0.03)

## 2017-05-28 LAB — COMPREHENSIVE METABOLIC PANEL
ALBUMIN: 4 g/dL (ref 3.5–5.0)
ALK PHOS: 82 U/L (ref 38–126)
ALT: 56 U/L — AB (ref 14–54)
ANION GAP: 12 (ref 5–15)
AST: 53 U/L — AB (ref 15–41)
BILIRUBIN TOTAL: 1.1 mg/dL (ref 0.3–1.2)
BUN: 12 mg/dL (ref 6–20)
CALCIUM: 9.2 mg/dL (ref 8.9–10.3)
CO2: 26 mmol/L (ref 22–32)
CREATININE: 1.34 mg/dL — AB (ref 0.44–1.00)
Chloride: 101 mmol/L (ref 101–111)
GFR calc Af Amer: 53 mL/min — ABNORMAL LOW (ref >60)
GFR calc non Af Amer: 46 mL/min — ABNORMAL LOW (ref >60)
GLUCOSE: 108 mg/dL — AB (ref 65–99)
Potassium: 3 mmol/L — ABNORMAL LOW (ref 3.5–5.1)
Sodium: 139 mmol/L (ref 135–145)
TOTAL PROTEIN: 7 g/dL (ref 6.5–8.1)

## 2017-05-28 LAB — BASIC METABOLIC PANEL
Anion gap: 12 (ref 5–15)
BUN: 10 mg/dL (ref 6–20)
CALCIUM: 9.1 mg/dL (ref 8.9–10.3)
CO2: 27 mmol/L (ref 22–32)
CREATININE: 1.29 mg/dL — AB (ref 0.44–1.00)
Chloride: 102 mmol/L (ref 101–111)
GFR calc non Af Amer: 48 mL/min — ABNORMAL LOW (ref >60)
GFR, EST AFRICAN AMERICAN: 56 mL/min — AB (ref >60)
Glucose, Bld: 112 mg/dL — ABNORMAL HIGH (ref 65–99)
Potassium: 3.1 mmol/L — ABNORMAL LOW (ref 3.5–5.1)
SODIUM: 141 mmol/L (ref 135–145)

## 2017-05-28 LAB — GLUCOSE, CAPILLARY
Glucose-Capillary: 112 mg/dL — ABNORMAL HIGH (ref 65–99)
Glucose-Capillary: 110 mg/dL — ABNORMAL HIGH (ref 65–99)
Glucose-Capillary: 105 mg/dL — ABNORMAL HIGH (ref 65–99)
Glucose-Capillary: 131 mg/dL — ABNORMAL HIGH (ref 65–99)

## 2017-05-28 LAB — PROCALCITONIN: Procalcitonin: 4.89 ng/mL

## 2017-05-28 LAB — HIV ANTIBODY (ROUTINE TESTING W REFLEX): HIV Screen 4th Generation wRfx: NONREACTIVE

## 2017-05-28 LAB — CREATININE, SERUM
CREATININE: 1.3 mg/dL — AB (ref 0.44–1.00)
GFR calc Af Amer: 55 mL/min — ABNORMAL LOW (ref >60)
GFR calc non Af Amer: 48 mL/min — ABNORMAL LOW (ref >60)

## 2017-05-28 LAB — BETA-HYDROXYBUTYRIC ACID: Beta-Hydroxybutyric Acid: 0.41 mmol/L — ABNORMAL HIGH (ref 0.05–0.27)

## 2017-05-28 LAB — STREP PNEUMONIAE URINARY ANTIGEN: STREP PNEUMO URINARY ANTIGEN: NEGATIVE

## 2017-05-28 LAB — MRSA PCR SCREENING: MRSA BY PCR: NEGATIVE

## 2017-05-28 LAB — MAGNESIUM: Magnesium: 1.8 mg/dL (ref 1.7–2.4)

## 2017-05-28 MED ORDER — ASPIRIN EC 81 MG PO TBEC
81.0000 mg | DELAYED_RELEASE_TABLET | Freq: Every day | ORAL | Status: DC
  Administered 2017-05-28 – 2017-06-02 (×6): 81 mg via ORAL
  Filled 2017-05-28 (×6): qty 1

## 2017-05-28 MED ORDER — NITROGLYCERIN IN D5W 200-5 MCG/ML-% IV SOLN
0.0000 ug/min | INTRAVENOUS | Status: DC
  Administered 2017-05-28: 4.5 ug/min via INTRAVENOUS

## 2017-05-28 MED ORDER — AMLODIPINE BESYLATE 10 MG PO TABS
10.0000 mg | ORAL_TABLET | Freq: Every day | ORAL | Status: DC
  Administered 2017-05-28 – 2017-05-31 (×4): 10 mg via ORAL
  Filled 2017-05-28 (×5): qty 1

## 2017-05-28 MED ORDER — ZOLPIDEM TARTRATE 5 MG PO TABS
5.0000 mg | ORAL_TABLET | Freq: Every evening | ORAL | Status: DC | PRN
  Administered 2017-05-28 – 2017-06-01 (×3): 5 mg via ORAL
  Filled 2017-05-28 (×3): qty 1

## 2017-05-28 MED ORDER — HYDRALAZINE HCL 20 MG/ML IJ SOLN: 10.0000 mg | INTRAMUSCULAR | Status: DC | PRN

## 2017-05-28 MED ORDER — HYDRALAZINE HCL 25 MG PO TABS
25.0000 mg | ORAL_TABLET | Freq: Three times a day (TID) | ORAL | Status: DC
  Administered 2017-05-28 – 2017-05-30 (×4): 25 mg via ORAL
  Filled 2017-05-28 (×5): qty 1

## 2017-05-28 NOTE — Progress Notes (Signed)
ABG collected  

## 2017-05-28 NOTE — Progress Notes (Signed)
NURSING PROGRESS NOTE  Lindsey Singh [id number] Transfer Data: 05/28/2017 2:59 PM Attending Provider: Richarda Overlie, MD JIR:[id number], No Pcp Per Code Status: FULL   Lindsey Singh is a 46 y.o. female patient transferred from 30 M  -No acute distress noted.  -No complaints of shortness of breath.  -No complaints of chest pain.   Cardiac Monitoring: Box # 32 in place. Cardiac monitor yields:normal sinus rhythm.  Last Documented Vital Signs: Blood pressure (!) 157/98, pulse 98, temperature 98.6 F (37 C), temperature source Oral, resp. rate (!) 22, height 4\' 8"  (1.422 m), weight 73.7 kg (162 lb 7.7 oz), SpO2 96 %.  IV Fluids:  IV in place, occlusive dsg intact without redness, IV cath forearm right, condition patent and no redness IV push only, no IV fluids.   Allergies:  Morphine and related  Past Medical History:   has a past medical history of Anxiety and Hypertension.  Past Surgical History:   has no past surgical history on file.  Social History:   reports that she has never smoked. She has never used smokeless tobacco. She reports that she does not drink alcohol or use drugs.  Skin: intact except where otherwise charted  Patient/Family orientated to room. Information packet given to patient/family. Admission inpatient armband information verified with patient/family to include name and date of birth and placed on patient arm. Side rails up x 2, fall assessment and education completed with patient/family. Patient/family able to verbalize understanding of risk associated with falls and verbalized understanding to call for assistance before getting out of bed. Call light within reach. Patient/family able to voice and demonstrate understanding of unit orientation instructions.

## 2017-05-28 NOTE — Progress Notes (Signed)
Nutrition Brief Note  Patient identified on the Malnutrition Screening Tool (MST) Report  Wt Readings from Last 15 Encounters:  05/28/17 162 lb 7.7 oz (73.7 kg)   Lindsey Singh is a 46 y.o. female with history of hypertension who is originally from [place] visiting her sister started developing shortness of breath today while trying [place].   Pt admitted with acute respiratory failure with hypoxia secondary to pulmonary edema and pneumonia.   Pt receiving nursing care or bathing at time of visit. Pt did not appears to have any signs of fat or muscle depletion.   Body mass index is 36.43 kg/m. Patient meets criteria for obesity, class II based on current BMI.   Current diet order is Heart Healthy/ Carb Modified, patient is consuming approximately 50% of meals at this time. Labs and medications reviewed.   No nutrition interventions warranted at this time. If nutrition issues arise, please consult RD.   Susen Haskew A. Mayford Knife, RD, LDN, CDE Pager: [phone] After hours Pager: [phone]

## 2017-05-28 NOTE — Progress Notes (Signed)
05/28/17 2220  What Happened  Was fall witnessed? No  Was patient injured? Unsure  Patient found on floor  Found by Staff-comment  Stated prior activity bathroom-unassisted  Follow Up  MD notified Dr.Kakrakandy  Time MD notified 2230  Family notified No- patient refusal  Additional tests Yes-comment (CT head)  Adult Fall Risk Assessment  Risk Factor Category (scoring not indicated) Fall has occurred during this admission (document High fall risk)  Age 46  Fall History: Fall within 6 months prior to admission 0  Elimination; Bowel and/or Urine Incontinence 0  Elimination; Bowel and/or Urine Urgency/Frequency 0  Medications: includes PCA/Opiates, Anti-convulsants, Anti-hypertensives, Diuretics, Hypnotics, Laxatives, Sedatives, and Psychotropics 5  Patient Care Equipment 1  Mobility-Assistance 2  Mobility-Gait 2  Mobility-Sensory Deficit 2  Altered awareness of immediate physical environment 1  Impulsiveness 0  Lack of understanding of one's physical/cognitive limitations 4  Total Score 17  Patient's Fall Risk High Fall Risk (>13 points)  Adult Fall Risk Interventions  Required Bundle Interventions *See Row Information* High fall risk - low, moderate, and high requirements implemented  Additional Interventions Use of appropriate toileting equipment (bedpan, BSC, etc.)  Screening for Fall Injury Risk  Risk For Fall Injury- See Row Information  None identified  Vitals  BP 108/60  BP Location Left Arm  BP Method Automatic  Patient Position (if appropriate) Sitting  Pain Assessment  Pain Assessment 0-10  Pain Score 0  Neurological  Neuro (WDL) X  Level of Consciousness Alert  Orientation Level Oriented X4  Cognition Appropriate at baseline;Appropriate attention/concentration;Appropriate judgement;Appropriate safety awareness;Follows commands  Speech Clear  Pupil Assessment  Yes  R Pupil Size (mm) 2  R Pupil Shape Round  R Pupil Reaction Brisk  L Pupil Size (mm) 2  L  Pupil Shape Round  L Pupil Reaction Brisk  Additional Pupil Assessments No  Motor Function/Sensation Assessment Grip;Elbow extension;Elbow flexion;Dorsiflexion;Motor response;Sensation;Motor strength  R Hand Grip Present;Moderate  L Hand Grip Present;Moderate   R Elbow Extension (Push/Biceps) Present;Moderate  L Elbow Extension (Push/Biceps) Present;Moderate  R Elbow Flexion (Pull/Triceps) Present;Moderate  L Elbow Flexion (Pull/Triceps) Present;Moderate  R Foot Dorsiflexion Present;Moderate  L Foot Dorsiflexion Present;Moderate  RUE Motor Response Purposeful movement;Responds to commands  RUE Sensation Full sensation  RUE Motor Strength 5  LUE Motor Response Purposeful movement  LUE Sensation Full sensation  LUE Motor Strength 5  RLE Motor Response Purposeful movement  RLE Sensation Full sensation  RLE Motor Strength 5  LLE Motor Response Purposeful movement  LLE Sensation Full sensation  LLE Motor Strength 5

## 2017-05-28 NOTE — Significant Event (Signed)
Rapid Response Event Note  Overview: Time Called: 2233 Arrival Time: 2235 Event Type: Other (Comment) (Unwitness fall)  Initial Focused Assessment: Unwitness fall   Interventions: No RRT interventions  Plan of Care (if not transferred):  Event Summary: Called to evaluate this patient after having an unwitness fall while walking from the restroom. She appears drowsy but easy to arouse. Unit staff reported patient had Ambien about 45 minutes ago. No complaints of pain or discomfort. MAE X 4. Skin is warm and dry. PERRL. No signs of injuries noted. On-call provider was made aware and a CT of head was ordered. Bedrest recommend with bed alarm. No RRT interventions needed at this time   Lindsey Singh

## 2017-05-28 NOTE — Progress Notes (Signed)
Report given to Maralyn Sago, RN on [facility]. Patient to be transferred to [phone].

## 2017-05-28 NOTE — Progress Notes (Signed)
CRITICAL VALUE ALERT  Critical Value:  Troponin 0.24  Date & Time Notied:  05/28/17 0420   Provider Notified: Toniann Fail, MD  Orders Received/Actions taken: Yes.

## 2017-05-28 NOTE — Progress Notes (Signed)
[facility] PROGRESS NOTE  Lindsey Singh ZOX:[id number] DOB: 1970/12/18 DOA: 05/27/2017   PCP: Patient, No Pcp Per     Assessment/Plan: Principal Problem:   Acute respiratory failure with hypoxia (HCC) Active Problems:   Acute pulmonary edema (HCC)   Hypertensive urgency   Acute renal failure (ARF) (HCC)   Fever   Hyperglycemia   Acute respiratory failure (HCC)    Lindsey Singh is a 46 y.o. female with history of hypertension who is originally from [place] visiting her sister started developing shortness of breath today while trying to visit the [place] parade. Patient was in the car and was brought straight to the ER.  In the ER patient was found to be markedly hypertensive with blood pressure more than 200 systolic. Chest x-ray was showing congestion concerning for CHF. EKG was showing sinus tachycardia with LBBB. BNP was 1700. Patient also had fever with leukocytosis. Patient was started on Lasix 60 mg IV and placed on BiPAP and since blood pressure was not improving started on nitroglycerin infusion. Antibiotics for possible pneumonia.  Assessment and plan  1. Acute respiratory failure with hypoxia secondary to pulmonary edema and pneumonia - patient given Lasix 60 mg IV 1 dose followed by 40 mg IV every 12. Patient is on nitroglycerin infusion which will be tapered off.   trending troponins 0.14>0.24>0.22 ,   check 2-D echo to rule out wall motion abnormalities. Initial VBG showed a pH 7.29, PCO2 42, PO2 74.  Consult cardiology if echo abnl. Not on ACE inhibitor or ARB secondary to renal failure. 2. Possible pneumonia - continue empiric antibiotics. Patient also has been having productive cough. Patient is placed on ceftriaxone and Zithromax for community-acquired pneumonia. Follow blood cultures sputum cultures urine for Legionella and strep antigen.  procalcitonin levels pending. Urine antigen studies pending 3. Hypertensive urgency - patient initially on   nitroglycerin infusion, dose of Norvasc increased to 10 mg /day. Patient   was on ACE inhibitor and hydrochlorothiazide which  are being held, since patient is on Lasix and also has renal failure. 4. Hyperglycemia - new onset diabetes. Check hemoglobin A1c.  Currently on sliding scale coverage. 5. Anemia normocytic normochromic - hemoglobin stable 6. Acute renal failure - no old labs to compare. Platelets are normal. Closely observe since patient is receiving Lasix. Slightly improved since admission from 1.63-1.3   DVT prophylaxsis Lovenox  Code Status:  Full code   Family Communication: Discussed in detail with the patient, all imaging results, lab results explained to the patient   Disposition Plan:   2-3 days      Consultants:  None  Procedures:  None  Antibiotics: Anti-infectives    Start     Dose/Rate Route Frequency Ordered Stop   05/28/17 2000  azithromycin (ZITHROMAX) 500 mg in dextrose 5 % 250 mL IVPB     500 mg 250 mL/hr over 60 Minutes Intravenous Every 24 hours 05/27/17 2237 06/04/17 1959   05/28/17 1800  cefTRIAXone (ROCEPHIN) 1 g in dextrose 5 % 50 mL IVPB     1 g 100 mL/hr over 30 Minutes Intravenous Every 24 hours 05/27/17 2237 06/04/17 1759   05/27/17 2015  cefTRIAXone (ROCEPHIN) 1 g in dextrose 5 % 50 mL IVPB     1 g 100 mL/hr over 30 Minutes Intravenous  Once 05/27/17 2006 05/27/17 2051   05/27/17 2015  azithromycin (ZITHROMAX) 500 mg in dextrose 5 % 250 mL IVPB  500 mg 250 mL/hr over 60 Minutes Intravenous  Once 05/27/17 2006 05/27/17 2204         HPI/Subjective: Feels sob has improved , no cp, off nitroglycerin  Objective: Vitals:   05/28/17 0045 05/28/17 0122 05/28/17 0200 05/28/17 0250  BP: (!) 145/94 (!) 149/96 (!) 152/98 (!) 158/104  Pulse:  88 93 92  Resp: (!) 25 (!) 24 (!) 27 17  Temp:    98 F (36.7 C)  TempSrc:    Oral  SpO2:  100% 100% 97%  Weight:    73.7 kg (162 lb 7.7 oz)  Height:    4\' 8"  (1.422 m)    Intake/Output  Summary (Last 24 hours) at 05/28/17 0853 Last data filed at 05/28/17 0300  Gross per 24 hour  Intake            17.98 ml  Output             1650 ml  Net         -1632.02 ml    Exam:  Examination:  General exam: Appears calm and comfortable  Respiratory system: Clear to auscultation. Respiratory effort normal. Cardiovascular system: S1 & S2 heard, RRR. No JVD, murmurs, rubs, gallops or clicks. No pedal edema. Gastrointestinal system: Abdomen is nondistended, soft and nontender. No organomegaly or masses felt. Normal bowel sounds heard. Central nervous system: Alert and oriented. No focal neurological deficits. Extremities: Symmetric 5 x 5 power. Skin: No rashes, lesions or ulcers Psychiatry: Judgement and insight appear normal. Mood & affect appropriate.     Data Reviewed: I have personally reviewed following labs and imaging studies  Micro Results Recent Results (from the past 240 hour(s))  MRSA PCR Screening     Status: None   Collection Time: 05/28/17  2:46 AM  Result Value Ref Range Status   MRSA by PCR NEGATIVE NEGATIVE Final    Comment:        The GeneXpert MRSA Assay (FDA approved for NASAL specimens only), is one component of a comprehensive MRSA colonization surveillance program. It is not intended to diagnose MRSA infection nor to guide or monitor treatment for MRSA infections.     Radiology Reports Dg Chest Portable 1 View  Result Date: 05/27/2017 CLINICAL DATA:  Sudden onset shortness of breath. EXAM: PORTABLE CHEST 1 VIEW COMPARISON:  None. FINDINGS: 1934 hours. Cardiopericardial silhouette is at upper limits of normal for size. Vascular congestion with probable interstitial pulmonary edema. Small bilateral pleural effusions suspected. The visualized bony structures of the thorax are intact. Telemetry leads overlie the chest. IMPRESSION: Low volume film with borderline to mild cardiomegaly, interstitial pulmonary edema pattern and small bilateral pleural  effusions. Electronically Signed   By: Kennith Center M.D.   On: 05/27/2017 19:49     CBC  Recent Labs Lab 05/27/17 1935 05/28/17 0250 05/28/17 0755  WBC 16.9* 19.9* 17.2*  HGB 11.5* 12.3 12.5  HCT 36.1 37.4 38.3  PLT 460* 415* 430*  MCV 91.4 88.4 88.7  MCH 29.1 29.1 28.9  MCHC 31.9 32.9 32.6  RDW 14.1 14.0 14.0  LYMPHSABS 6.8*  --   --   MONOABS 0.7  --   --   EOSABS 0.3  --   --   BASOSABS 0.0  --   --     Chemistries   Recent Labs Lab 05/27/17 1935 05/28/17 0250  NA 137  --   K 3.7  --   CL 105  --   CO2 16*  --  GLUCOSE 337*  --   BUN 10  --   CREATININE 1.63* 1.30*  CALCIUM 8.6*  --   MG  --  1.8  AST 55*  --   ALT 51  --   ALKPHOS 83  --   BILITOT 1.3*  --    ------------------------------------------------------------------------------------------------------------------ estimated creatinine clearance is 42.9 mL/min (A) (by C-G formula based on SCr of 1.3 mg/dL (H)). ------------------------------------------------------------------------------------------------------------------ No results for input(s): HGBA1C in the last 72 hours. ------------------------------------------------------------------------------------------------------------------ No results for input(s): CHOL, HDL, LDLCALC, TRIG, CHOLHDL, LDLDIRECT in the last 72 hours. ------------------------------------------------------------------------------------------------------------------  Recent Labs  05/27/17 2038  TSH 1.205   ------------------------------------------------------------------------------------------------------------------ No results for input(s): VITAMINB12, FOLATE, FERRITIN, TIBC, IRON, RETICCTPCT in the last 72 hours.  Coagulation profile No results for input(s): INR, PROTIME in the last 168 hours.  No results for input(s): DDIMER in the last 72 hours.  Cardiac Enzymes  Recent Labs Lab 05/28/17 0250  TROPONINI 0.24*    ------------------------------------------------------------------------------------------------------------------ Invalid input(s): POCBNP   CBG:  Recent Labs Lab 05/28/17 0806  GLUCAP 112*       Studies: Dg Chest Portable 1 View  Result Date: 05/27/2017 CLINICAL DATA:  Sudden onset shortness of breath. EXAM: PORTABLE CHEST 1 VIEW COMPARISON:  None. FINDINGS: 1934 hours. Cardiopericardial silhouette is at upper limits of normal for size. Vascular congestion with probable interstitial pulmonary edema. Small bilateral pleural effusions suspected. The visualized bony structures of the thorax are intact. Telemetry leads overlie the chest. IMPRESSION: Low volume film with borderline to mild cardiomegaly, interstitial pulmonary edema pattern and small bilateral pleural effusions. Electronically Signed   By: Kennith Center M.D.   On: 05/27/2017 19:49      No results found for: HGBA1C Lab Results  Component Value Date   CREATININE 1.30 (H) 05/28/2017       Scheduled Meds: . amLODipine  5 mg Oral Daily  . aspirin EC  81 mg Oral Daily  . enoxaparin (LOVENOX) injection  40 mg Subcutaneous Q24H  . furosemide  40 mg Intravenous Q12H  . gabapentin  300-600 mg Oral QHS  . insulin aspart  0-9 Units Subcutaneous TID WC  . venlafaxine XR  150 mg Oral Q breakfast   Continuous Infusions: . azithromycin    . cefTRIAXone (ROCEPHIN)  IV    . nitroGLYCERIN 15 mcg/min (05/28/17 0252)     LOS: 1 day    Time spent: >30 MINS    Richarda Overlie  [facility] Pager [phone]. If 7PM-7AM, please contact night-coverage at [email or web address], password [facility] 05/28/2017, 8:53 AM  LOS: 1 day

## 2017-05-28 NOTE — ED Notes (Signed)
Pt made aware of bed assignment 

## 2017-05-29 ENCOUNTER — Inpatient Hospital Stay (HOSPITAL_COMMUNITY): Payer: Self-pay

## 2017-05-29 DIAGNOSIS — I5021 Acute systolic (congestive) heart failure: Secondary | ICD-10-CM

## 2017-05-29 DIAGNOSIS — I10 Essential (primary) hypertension: Secondary | ICD-10-CM

## 2017-05-29 DIAGNOSIS — R0600 Dyspnea, unspecified: Secondary | ICD-10-CM

## 2017-05-29 DIAGNOSIS — I429 Cardiomyopathy, unspecified: Secondary | ICD-10-CM

## 2017-05-29 LAB — ECHOCARDIOGRAM COMPLETE
AVLVOTPG: 3 mmHg
CHL CUP DOP CALC LVOT VTI: 13.9 cm
CHL CUP STROKE VOLUME: 23 mL
EERAT: 12.8
FS: 17 % — AB (ref 28–44)
Height: 56 in
IVS/LV PW RATIO, ED: 1.09
LA diam end sys: 27 mm
LADIAMINDEX: 1.67 cm/m2
LASIZE: 27 mm
LAVOL: 40.9 mL
LAVOLA4C: 39.5 mL
LAVOLIN: 25.2 mL/m2
LV E/e' medial: 12.8
LV PW d: 11 mm — AB (ref 0.6–1.1)
LV SIMPSON'S DISK: 31
LV e' LATERAL: 5 cm/s
LV sys vol index: 32 mL/m2
LV sys vol: 51 mL — AB (ref 14–42)
LVDIAVOL: 74 mL (ref 46–106)
LVDIAVOLIN: 46 mL/m2
LVEEAVG: 12.8
LVOTPV: 82 cm/s
MV pk E vel: 64 m/s
MVPKAVEL: 90.2 m/s
RV LATERAL S' VELOCITY: 7.83 cm/s
TAPSE: 16.5 mm
TDI e' lateral: 5
TDI e' medial: 3.37
Weight: 2582.4 oz

## 2017-05-29 LAB — CBC
HCT: 39.8 % (ref 36.0–46.0)
Hemoglobin: 13.1 g/dL (ref 12.0–15.0)
MCH: 29 pg (ref 26.0–34.0)
MCHC: 32.9 g/dL (ref 30.0–36.0)
MCV: 88.2 fL (ref 78.0–100.0)
PLATELETS: 387 10*3/uL (ref 150–400)
RBC: 4.51 MIL/uL (ref 3.87–5.11)
RDW: 14.1 % (ref 11.5–15.5)
WBC: 12.6 10*3/uL — AB (ref 4.0–10.5)

## 2017-05-29 LAB — BLOOD GAS, ARTERIAL
Acid-Base Excess: 4.3 mmol/L — ABNORMAL HIGH (ref 0.0–2.0)
Bicarbonate: 27.2 mmol/L (ref 20.0–28.0)
Drawn by: 441661
FIO2: 21
O2 Saturation: 98 %
PCO2 ART: 32.4 mmHg (ref 32.0–48.0)
PO2 ART: 96 mmHg (ref 83.0–108.0)
Patient temperature: 97.7
pH, Arterial: 7.532 — ABNORMAL HIGH (ref 7.350–7.450)

## 2017-05-29 LAB — BASIC METABOLIC PANEL
ANION GAP: 11 (ref 5–15)
BUN: 11 mg/dL (ref 6–20)
CALCIUM: 9.4 mg/dL (ref 8.9–10.3)
CO2: 27 mmol/L (ref 22–32)
Chloride: 97 mmol/L — ABNORMAL LOW (ref 101–111)
Creatinine, Ser: 1.32 mg/dL — ABNORMAL HIGH (ref 0.44–1.00)
GFR, EST AFRICAN AMERICAN: 54 mL/min — AB (ref >60)
GFR, EST NON AFRICAN AMERICAN: 47 mL/min — AB (ref >60)
Glucose, Bld: 133 mg/dL — ABNORMAL HIGH (ref 65–99)
Potassium: 2.8 mmol/L — ABNORMAL LOW (ref 3.5–5.1)
Sodium: 135 mmol/L (ref 135–145)

## 2017-05-29 LAB — PROCALCITONIN: PROCALCITONIN: 6.44 ng/mL

## 2017-05-29 LAB — GLUCOSE, CAPILLARY
GLUCOSE-CAPILLARY: 89 mg/dL (ref 65–99)
GLUCOSE-CAPILLARY: 115 mg/dL — AB (ref 65–99)
GLUCOSE-CAPILLARY: 115 mg/dL — AB (ref 65–99)
Glucose-Capillary: 107 mg/dL — ABNORMAL HIGH (ref 65–99)

## 2017-05-29 LAB — HEMOGLOBIN A1C
HEMOGLOBIN A1C: 5.6 % (ref 4.8–5.6)
Mean Plasma Glucose: 114 mg/dL

## 2017-05-29 LAB — AMMONIA: AMMONIA: 38 umol/L — AB (ref 9–35)

## 2017-05-29 MED ORDER — AZITHROMYCIN 500 MG PO TABS
500.0000 mg | ORAL_TABLET | Freq: Every day | ORAL | Status: DC
  Administered 2017-05-29 – 2017-05-31 (×3): 500 mg via ORAL
  Filled 2017-05-29 (×3): qty 1

## 2017-05-29 MED ORDER — GABAPENTIN 100 MG PO CAPS
200.0000 mg | ORAL_CAPSULE | Freq: Every day | ORAL | Status: DC
  Administered 2017-05-29 – 2017-06-01 (×4): 200 mg via ORAL
  Filled 2017-05-29 (×4): qty 2

## 2017-05-29 MED ORDER — CARVEDILOL 3.125 MG PO TABS
3.1250 mg | ORAL_TABLET | Freq: Two times a day (BID) | ORAL | Status: DC
  Administered 2017-05-29 – 2017-06-01 (×6): 3.125 mg via ORAL
  Filled 2017-05-29 (×6): qty 1

## 2017-05-29 MED ORDER — POTASSIUM CHLORIDE CRYS ER 20 MEQ PO TBCR
40.0000 meq | EXTENDED_RELEASE_TABLET | Freq: Two times a day (BID) | ORAL | Status: AC
  Administered 2017-05-29 – 2017-05-30 (×3): 40 meq via ORAL
  Filled 2017-05-29 (×3): qty 2

## 2017-05-29 NOTE — Progress Notes (Addendum)
[facility] PROGRESS NOTE  [name] AVW:[id number] DOB: 1971-09-11 DOA: 05/27/2017   PCP: Patient, No Pcp Per     Assessment/Plan: Principal Problem:   Acute respiratory failure with hypoxia (HCC) Active Problems:   Acute pulmonary edema (HCC)   Hypertensive urgency   Acute renal failure (ARF) (HCC)   Fever   Hyperglycemia   Acute respiratory failure (HCC)    Lindsey Singh is a 46 y.o. female with history of hypertension who is originally from [place] visiting her sister started developing shortness of breath today while trying to visit the [place]. Patient was in the car and was brought straight to the ER.  In the ER patient was found to be markedly hypertensive with blood pressure more than 200 systolic. Chest x-ray was showing congestion concerning for CHF. EKG was showing sinus tachycardia with LBBB. BNP was 1700. Patient also had fever with leukocytosis. Patient was started on Lasix 60 mg IV and placed on BiPAP and since blood pressure was not improving started on nitroglycerin infusion. Antibiotics for possible pneumonia.  Assessment and plan  1. Acute respiratory failure with hypoxia secondary to pulmonary edema and pneumonia - patient given Lasix 60 mg IV 1 dose followed by 40 mg IV every 12 hours. Initially placed on nitroglycerin infusion which was tapered off.   trending troponins 0.14>0.24>0.22>0.17 , 2-D echo still pending. Requested cardiology for further evaluation if the patient could have underlying ischemic cardiomyopathy. Scheduled for cath Monday   . Initial VBG showed a pH 7.29, PCO2 42, PO2 74.   . Not on ACE inhibitor or ARB secondary to renal failure. 2. Possible pneumonia - continue empiric antibiotics. Patient also has been having productive cough. Patient is placed on ceftriaxone and Zithromax since admission for community-acquired pneumonia. Follow blood cultures , sputum cultures , urine for Legionella and  urine strep antigen  was negative.  procalcitonin elevated but now trending down. Urine Legionella antigen  pending 3. Hypertensive urgency - patient initially on  nitroglycerin infusion, dose of Norvasc increased to 10 mg /day. Patient   was on ACE inhibitor and hydrochlorothiazide which  are being held, since patient is on Lasix and also has renal failure. 4. Hyperglycemia - new onset diabetes.   hemoglobin A1c 5.6.  Currently on sliding scale coverage. 5. Anemia normocytic normochromic - hemoglobin stable 6. Acute renal failure - no old labs to compare. Platelets are normal. Closely observe since patient is receiving Lasix. Slightly improved since admission from 1.63-1.3 7. Hypokalemia-replete 8. Fall-CT head normal-PT OT to evaluate. She received Neurontin last night. Dose has been decreased to 200 mg daily at bedtime    DVT prophylaxsis Lovenox  Code Status:  Full code   Family Communication: Discussed in detail with the patient, all imaging results, lab results explained to the patient   Disposition Plan:   2-3 days      Consultants:   cardiology  Procedures:  None  Antibiotics: Anti-infectives    Start     Dose/Rate Route Frequency Ordered Stop   05/28/17 2000  azithromycin (ZITHROMAX) 500 mg in dextrose 5 % 250 mL IVPB     500 mg 250 mL/hr over 60 Minutes Intravenous Every 24 hours 05/27/17 2237 06/04/17 1959   05/28/17 1800  cefTRIAXone (ROCEPHIN) 1 g in dextrose 5 % 50 mL IVPB     1 g 100 mL/hr over 30 Minutes Intravenous Every 24 hours 05/27/17 2237 06/04/17 1759   05/27/17 2015  cefTRIAXone (ROCEPHIN) 1 g in dextrose 5 % 50 mL IVPB     1 g 100 mL/hr over 30 Minutes Intravenous  Once 05/27/17 2006 05/27/17 2051   05/27/17 2015  azithromycin (ZITHROMAX) 500 mg in dextrose 5 % 250 mL IVPB     500 mg 250 mL/hr over 60 Minutes Intravenous  Once 05/27/17 2006 05/27/17 2204         HPI/Subjective:  Patient fell last night due to refusal to be assisted to the bathroom, follow-up  unwitnessed,  Objective: Vitals:   05/29/17 0443 05/29/17 0627 05/29/17 0640 05/29/17 0935  BP: 140/88 140/83  (!) 142/90  Pulse: 81 85    Resp: 16 16    Temp: 97.7 F (36.5 C) 98.6 F (37 C)    TempSrc: Oral Oral    SpO2: 100% 100%    Weight:   73.2 kg (161 lb 6.4 oz)   Height:        Intake/Output Summary (Last 24 hours) at 05/29/17 1045 Last data filed at 05/29/17 0900  Gross per 24 hour  Intake              540 ml  Output             1400 ml  Net             -860 ml    Exam:  Examination:  General exam: Comfortable, in no acute distress Respiratory system: Clear to auscultation. Respiratory effort normal. Cardiovascular system: S1 & S2 heard, RRR. No JVD, murmurs, rubs, gallops or clicks. No pedal edema. Gastrointestinal system: Abdomen is nondistended, soft and nontender. No organomegaly or masses felt. Normal bowel sounds heard. Central nervous system: Alert and oriented. No focal neurological deficits. Extremities: Symmetric 5 x 5 power. Skin: No rashes, lesions or ulcers Psychiatry: Judgement and insight appear normal. Mood & affect appropriate.     Data Reviewed: I have personally reviewed following labs and imaging studies  Micro Results Recent Results (from the past 240 hour(s))  Blood culture (routine x 2)     Status: None (Preliminary result)   Collection Time: 05/27/17  7:52 PM  Result Value Ref Range Status   Specimen Description BLOOD RIGHT ARM  Final   Special Requests   Final    BOTTLES DRAWN AEROBIC AND ANAEROBIC Blood Culture adequate volume   Culture NO GROWTH < 24 HOURS  Final   Report Status PENDING  Incomplete  Blood culture (routine x 2)     Status: None (Preliminary result)   Collection Time: 05/27/17  7:59 PM  Result Value Ref Range Status   Specimen Description BLOOD LEFT HAND  Final   Special Requests   Final    BOTTLES DRAWN AEROBIC AND ANAEROBIC Blood Culture adequate volume   Culture NO GROWTH < 24 HOURS  Final   Report Status  PENDING  Incomplete  MRSA PCR Screening     Status: None   Collection Time: 05/28/17  2:46 AM  Result Value Ref Range Status   MRSA by PCR NEGATIVE NEGATIVE Final    Comment:        The GeneXpert MRSA Assay (FDA approved for NASAL specimens only), is one component of a comprehensive MRSA colonization surveillance program. It is not intended to diagnose MRSA infection nor to guide or monitor treatment for MRSA infections.     Radiology Reports Ct Head Wo Contrast  Result Date: 05/29/2017 CLINICAL DATA:  Fall the EXAM: CT HEAD WITHOUT CONTRAST TECHNIQUE: Contiguous axial  images were obtained from the base of the skull through the vertex without intravenous contrast. COMPARISON:  None. FINDINGS: Brain: No mass lesion, intraparenchymal hemorrhage or extra-axial collection. No evidence of acute cortical infarct. Brain parenchyma and CSF-containing spaces are normal for age. Vascular: No hyperdense vessel or unexpected calcification. Skull: Normal visualized skull base, calvarium and extracranial soft tissues. Sinuses/Orbits: No sinus fluid levels or advanced mucosal thickening. No mastoid effusion. Normal orbits. IMPRESSION: Normal head CT. Electronically Signed   By: Deatra Robinson M.D.   On: 05/29/2017 01:19   Dg Chest Portable 1 View  Result Date: 05/27/2017 CLINICAL DATA:  Sudden onset shortness of breath. EXAM: PORTABLE CHEST 1 VIEW COMPARISON:  None. FINDINGS: 1934 hours. Cardiopericardial silhouette is at upper limits of normal for size. Vascular congestion with probable interstitial pulmonary edema. Small bilateral pleural effusions suspected. The visualized bony structures of the thorax are intact. Telemetry leads overlie the chest. IMPRESSION: Low volume film with borderline to mild cardiomegaly, interstitial pulmonary edema pattern and small bilateral pleural effusions. Electronically Signed   By: Kennith Center M.D.   On: 05/27/2017 19:49     CBC  Recent Labs Lab 05/27/17 1935  05/28/17 0250 05/28/17 0755 05/28/17 2332  WBC 16.9* 19.9* 17.2* 12.6*  HGB 11.5* 12.3 12.5 13.1  HCT 36.1 37.4 38.3 39.8  PLT 460* 415* 430* 387  MCV 91.4 88.4 88.7 88.2  MCH 29.1 29.1 28.9 29.0  MCHC 31.9 32.9 32.6 32.9  RDW 14.1 14.0 14.0 14.1  LYMPHSABS 6.8*  --   --   --   MONOABS 0.7  --   --   --   EOSABS 0.3  --   --   --   BASOSABS 0.0  --   --   --     Chemistries   Recent Labs Lab 05/27/17 1935 05/28/17 0250 05/28/17 0755 05/28/17 1053 05/28/17 2332  NA 137  --  141 139 135  K 3.7  --  3.1* 3.0* 2.8*  CL 105  --  102 101 97*  CO2 16*  --  27 26 27   GLUCOSE 337*  --  112* 108* 133*  BUN 10  --  10 12 11   CREATININE 1.63* 1.30* 1.29* 1.34* 1.32*  CALCIUM 8.6*  --  9.1 9.2 9.4  MG  --  1.8  --   --   --   AST 55*  --   --  53*  --   ALT 51  --   --  56*  --   ALKPHOS 83  --   --  82  --   BILITOT 1.3*  --   --  1.1  --    ------------------------------------------------------------------------------------------------------------------ estimated creatinine clearance is 42 mL/min (A) (by C-G formula based on SCr of 1.32 mg/dL (H)). ------------------------------------------------------------------------------------------------------------------  Recent Labs  05/28/17 0250  HGBA1C 5.6   ------------------------------------------------------------------------------------------------------------------ No results for input(s): CHOL, HDL, LDLCALC, TRIG, CHOLHDL, LDLDIRECT in the last 72 hours. ------------------------------------------------------------------------------------------------------------------  Recent Labs  05/27/17 2038  TSH 1.205   ------------------------------------------------------------------------------------------------------------------ No results for input(s): VITAMINB12, FOLATE, FERRITIN, TIBC, IRON, RETICCTPCT in the last 72 hours.  Coagulation profile No results for input(s): INR, PROTIME in the last 168 hours.  No results  for input(s): DDIMER in the last 72 hours.  Cardiac Enzymes  Recent Labs Lab 05/28/17 0250 05/28/17 0755 05/28/17 1502  TROPONINI 0.24* 0.22* 0.17*   ------------------------------------------------------------------------------------------------------------------ Invalid input(s): POCBNP   CBG:  Recent Labs Lab 05/28/17 0806 05/28/17 1152 05/28/17 1722 05/28/17  2114 05/29/17 0819  GLUCAP 112* 110* 105* 131* 89       Studies: Ct Head Wo Contrast  Result Date: 05/29/2017 CLINICAL DATA:  Fall the EXAM: CT HEAD WITHOUT CONTRAST TECHNIQUE: Contiguous axial images were obtained from the base of the skull through the vertex without intravenous contrast. COMPARISON:  None. FINDINGS: Brain: No mass lesion, intraparenchymal hemorrhage or extra-axial collection. No evidence of acute cortical infarct. Brain parenchyma and CSF-containing spaces are normal for age. Vascular: No hyperdense vessel or unexpected calcification. Skull: Normal visualized skull base, calvarium and extracranial soft tissues. Sinuses/Orbits: No sinus fluid levels or advanced mucosal thickening. No mastoid effusion. Normal orbits. IMPRESSION: Normal head CT. Electronically Signed   By: Deatra Robinson M.D.   On: 05/29/2017 01:19   Dg Chest Portable 1 View  Result Date: 05/27/2017 CLINICAL DATA:  Sudden onset shortness of breath. EXAM: PORTABLE CHEST 1 VIEW COMPARISON:  None. FINDINGS: 1934 hours. Cardiopericardial silhouette is at upper limits of normal for size. Vascular congestion with probable interstitial pulmonary edema. Small bilateral pleural effusions suspected. The visualized bony structures of the thorax are intact. Telemetry leads overlie the chest. IMPRESSION: Low volume film with borderline to mild cardiomegaly, interstitial pulmonary edema pattern and small bilateral pleural effusions. Electronically Signed   By: Kennith Center M.D.   On: 05/27/2017 19:49      Lab Results  Component Value Date   HGBA1C  5.6 05/28/2017   Lab Results  Component Value Date   CREATININE 1.32 (H) 05/28/2017       Scheduled Meds: . amLODipine  10 mg Oral Daily  . aspirin EC  81 mg Oral Daily  . enoxaparin (LOVENOX) injection  40 mg Subcutaneous Q24H  . furosemide  40 mg Intravenous Q12H  . gabapentin  300-600 mg Oral QHS  . hydrALAZINE  25 mg Oral Q8H  . insulin aspart  0-9 Units Subcutaneous TID WC  . venlafaxine XR  150 mg Oral Q breakfast   Continuous Infusions: . azithromycin Stopped (05/29/17 0121)  . cefTRIAXone (ROCEPHIN)  IV Stopped (05/28/17 2131)  . nitroGLYCERIN 4.5 mcg/min (05/28/17 0931)     LOS: 2 days    Time spent: >30 MINS    Richarda Overlie  [facility] Pager [phone]. If 7PM-7AM, please contact night-coverage at [email or web address], password [email or web address] 05/29/2017, 10:45 AM  LOS: 2 days

## 2017-05-29 NOTE — Evaluation (Signed)
Physical Therapy Evaluation Patient Details Name: Lindsey Singh MRN: [id number] DOB: 12/08/1970 Today's Date: 05/29/2017   History of Present Illness  Lindsey Singh is a 46 y.o. female who is being seen today for the evaluation of CHF, +Trop at the request of Dr. Susie Cassette.Plan for heart cath [facility] July 9.    Clinical Impression  Pt admitted with above diagnosis. Pt currently with functional limitations due to the deficits listed below (see PT Problem List). Pt was able to ambulate with supervision without device on unit.  Plan for heart cath Monday. Will f/u on Mon pm or Tues to ensure that pt has continued to mobilize.  Also pt has a flight of stairs to bedroom.  Will follow acutely.  Pt will benefit from skilled PT to increase their independence and safety with mobility to allow discharge to the venue listed below.    Follow Up Recommendations Other (comment) (TBA after heart cath)    Equipment Recommendations  None recommended by PT    Recommendations for Other Services       Precautions / Restrictions Precautions Precautions: Fall Restrictions Weight Bearing Restrictions: No      Mobility  Bed Mobility Overal bed mobility: Independent                Transfers Overall transfer level: Needs assistance   Transfers: Sit to/from Stand Sit to Stand: Supervision            Ambulation/Gait Ambulation/Gait assistance: Supervision;Min guard Ambulation Distance (Feet): 300 Feet Assistive device: None Gait Pattern/deviations: Step-through pattern   Gait velocity interpretation: Below normal speed for age/gender General Gait Details: No real issues with gait today.  At times she reports she does not feel 100% but overall steady gait.   Stairs            Wheelchair Mobility    Modified Rankin (Stroke Patients Only)       Balance                                 Standardized Balance Assessment Standardized Balance Assessment : Dynamic Gait  Index   Dynamic Gait Index Level Surface: Normal Change in Gait Speed: Normal Gait with Horizontal Head Turns: Normal Gait with Vertical Head Turns: Normal Gait and Pivot Turn: Normal Step Over Obstacle: Mild Impairment Step Around Obstacles: Normal Steps: Mild Impairment Total Score: 22       Pertinent Vitals/Pain Pain Assessment: No/denies pain    Home Living Family/patient expects to be discharged to:: Private residence Living Arrangements: Parent;Other relatives Available Help at Discharge: Family;Available 24 hours/day (visiting here from [facility]) Type of Home: House Home Access: Level entry     Home Layout: Two level Home Equipment: Walker - 2 wheels;Walker - 4 wheels;Bedside commode;Shower seat Additional Comments: parents have alot of equipment they don't use    Prior Function Level of Independence: Independent         Comments: Pt had come here to help her sister on dialysis and her parents.      Hand Dominance        Extremity/Trunk Assessment   Upper Extremity Assessment Upper Extremity Assessment: Defer to OT evaluation    Lower Extremity Assessment Lower Extremity Assessment: Overall WFL for tasks assessed    Cervical / Trunk Assessment Cervical / Trunk Assessment: Normal  Communication   Communication: No difficulties  Cognition Arousal/Alertness: Awake/alert Behavior During Therapy: WFL for tasks assessed/performed Overall  Cognitive Status: Within Functional Limits for tasks assessed                                        General Comments General comments (skin integrity, edema, etc.): Scored 22/24 on DGI suggesting low risk of falls.     Exercises     Assessment/Plan    PT Assessment Patient needs continued PT services  PT Problem List Decreased mobility;Decreased knowledge of use of DME;Decreased safety awareness       PT Treatment Interventions DME instruction;Gait training;Functional mobility  training;Therapeutic activities;Therapeutic exercise;Stair training;Balance training;Patient/family education    PT Goals (Current goals can be found in the Care Plan section)  Acute Rehab PT Goals Patient Stated Goal: to get better PT Goal Formulation: With patient Time For Goal Achievement: 06/12/17 Potential to Achieve Goals: Good    Frequency Min 3X/week   Barriers to discharge        Co-evaluation               AM-PAC PT "6 Clicks" Daily Activity  Outcome Measure Difficulty turning over in bed (including adjusting bedclothes, sheets and blankets)?: None Difficulty moving from lying on back to sitting on the side of the bed? : None Difficulty sitting down on and standing up from a chair with arms (e.g., wheelchair, bedside commode, etc,.)?: None Help needed moving to and from a bed to chair (including a wheelchair)?: None Help needed walking in hospital room?: A Little Help needed climbing 3-5 steps with a railing? : A Little 6 Click Score: 22    End of Session Equipment Utilized During Treatment: Gait belt Activity Tolerance: Patient limited by fatigue Patient left: in chair;with call bell/phone within reach Nurse Communication: Mobility status PT Visit Diagnosis: Muscle weakness (generalized) (M62.81)    Time: 4035-2481 PT Time Calculation (min) (ACUTE ONLY): 18 min   Charges:   PT Evaluation $PT Eval Low Complexity: 1 Procedure     PT G Codes:        Riona Lahti,PT Acute Rehabilitation [phone] (pager)   Berline Lopes 05/29/2017, 4:25 PM

## 2017-05-29 NOTE — Progress Notes (Signed)
  Echocardiogram 2D Echocardiogram has been performed.  Lindsey Singh 05/29/2017, 11:49 AM

## 2017-05-29 NOTE — Consult Note (Signed)
Cardiology Consult    Patient ID: Lindsey Singh MRN: [id number], DOB/AGE: 48-Oct-1972   Admit date: 05/27/2017 Date of Consult: 05/29/2017  Primary Physician: Patient, No Pcp Per Primary Cardiologist: New Requesting Provider: Susie Cassette Reason for Consultation: CHF, + Trop  Lindsey Singh is a 46 y.o. female who is being seen today for the evaluation of CHF, +Trop at the request of Dr. Susie Cassette.  Patient Profile    46 yo female with PMH of HTN, and anxiety who presented with sudden onset dyspnea. Found to have new onset HF.   Past Medical History   Past Medical History:  Diagnosis Date  . Anxiety   . Hypertension     History reviewed. No pertinent surgical history.   Allergies  Allergies  Allergen Reactions  . Morphine And Related Swelling    History of Present Illness    Lindsey Singh is a 46 yo female with PMH of HTN, and anxiety. Currently lives in [place], but has been here about 2 months caring for her parents. Denies any known cardiac hx herself, but reports both her parents, have HF, and mother had a stent several years ago. She currently follows with a PCP at home, and on 3 different agents for HTN.   Reports going [place] in downtown [place] Wednesday evening. Was pulling into the garage deck there around 6pm and had a sudden onset of acute dyspnea. Felt as though she was suffocating. On EMS arrival she was placed on Bipap. Reports a nonproductive cough for the past couple of days prior to this episode.   In the ED was noted to be markedly hypertensive with systolic in the 200s. CXR was concerning edema with bilateral pleural effusions. Started on IV lasix in the ED. EKG showed ST with LBBB, no previous to compare. Hospitalist admitted for further work up. Echo this admission showed 30-35% EF with hypokinesis in the inferior, antero and septal walls with G1DD. Trop cycled with mild flat trend 0.24>>0.22>0.17. Denies any episodes of chest pain.   Inpatient  Medications    . amLODipine  10 mg Oral Daily  . aspirin EC  81 mg Oral Daily  . azithromycin  500 mg Oral QHS  . enoxaparin (LOVENOX) injection  40 mg Subcutaneous Q24H  . furosemide  40 mg Intravenous Q12H  . gabapentin  200 mg Oral QHS  . hydrALAZINE  25 mg Oral Q8H  . insulin aspart  0-9 Units Subcutaneous TID WC  . potassium chloride  40 mEq Oral BID  . venlafaxine XR  150 mg Oral Q breakfast    Family History    Family History  Problem Relation Age of Onset  . Diabetes Mellitus II Mother   . Hypertension Mother   . Hypertension Father     Social History    Social History   Social History  . Marital status: Single    Spouse name: N/A  . Number of children: N/A  . Years of education: N/A   Occupational History  . Not on file.   Social History Main Topics  . Smoking status: Never Smoker  . Smokeless tobacco: Never Used  . Alcohol use No  . Drug use: No  . Sexual activity: Not on file   Other Topics Concern  . Not on file   Social History Narrative  . No narrative on file     Review of Systems    See HPI  All other systems reviewed and are otherwise  negative except as noted above.  Physical Exam    Blood pressure 116/74, pulse 96, temperature 98.2 F (36.8 C), temperature source Oral, resp. rate 18, height 4\' 8"  (1.422 m), weight 161 lb 6.4 oz (73.2 kg), SpO2 99 %.  General: Pleasant younger Lindsey Singh, NAD Psych: Normal affect. Neuro: Alert and oriented X 3. Moves all extremities spontaneously. HEENT: Normal  Neck: Supple without bruits or JVD. Lungs:  Resp regular and unlabored, diminished in bilateral bases. Heart: RRR no s3, s4, or murmurs. Abdomen: Soft, non-tender, non-distended, BS + x 4.  Extremities: No clubbing, cyanosis, mild LE edema. DP/PT/Radials 2+ and equal bilaterally.  Labs    Troponin [facility] Test)  Recent Labs  05/27/17 2246  TROPIPOC 0.14*    Recent Labs  05/28/17 0250 05/28/17 0755 05/28/17 1502  TROPONINI  0.24* 0.22* 0.17*   Lab Results  Component Value Date   WBC 12.6 (H) 05/28/2017   HGB 13.1 05/28/2017   HCT 39.8 05/28/2017   MCV 88.2 05/28/2017   PLT 387 05/28/2017    Recent Labs Lab 05/28/17 1053 05/28/17 2332  NA 139 135  K 3.0* 2.8*  CL 101 97*  CO2 26 27  BUN 12 11  CREATININE 1.34* 1.32*  CALCIUM 9.2 9.4  PROT 7.0  --   BILITOT 1.1  --   ALKPHOS 82  --   ALT 56*  --   AST 53*  --   GLUCOSE 108* 133*   No results found for: CHOL, HDL, LDLCALC, TRIG No results found for: [facility]   Radiology Studies    Ct Head Wo Contrast  Result Date: 05/29/2017 CLINICAL DATA:  Fall the EXAM: CT HEAD WITHOUT CONTRAST TECHNIQUE: Contiguous axial images were obtained from the base of the skull through the vertex without intravenous contrast. COMPARISON:  None. FINDINGS: Brain: No mass lesion, intraparenchymal hemorrhage or extra-axial collection. No evidence of acute cortical infarct. Brain parenchyma and CSF-containing spaces are normal for age. Vascular: No hyperdense vessel or unexpected calcification. Skull: Normal visualized skull base, calvarium and extracranial soft tissues. Sinuses/Orbits: No sinus fluid levels or advanced mucosal thickening. No mastoid effusion. Normal orbits. IMPRESSION: Normal head CT. Electronically Signed   By: Deatra Robinson M.D.   On: 05/29/2017 01:19   Dg Chest Portable 1 View  Result Date: 05/27/2017 CLINICAL DATA:  Sudden onset shortness of breath. EXAM: PORTABLE CHEST 1 VIEW COMPARISON:  None. FINDINGS: 1934 hours. Cardiopericardial silhouette is at upper limits of normal for size. Vascular congestion with probable interstitial pulmonary edema. Small bilateral pleural effusions suspected. The visualized bony structures of the thorax are intact. Telemetry leads overlie the chest. IMPRESSION: Low volume film with borderline to mild cardiomegaly, interstitial pulmonary edema pattern and small bilateral pleural effusions. Electronically Signed   By: Kennith Center  M.D.   On: 05/27/2017 19:49    ECG & Cardiac Imaging    EKG: SR with LBBB  Echo: 05/29/17  Study Conclusions  - Left ventricle: LVEF is approximately 30 to 35% with severe   hypokinesis/akinesis of the inferior, anteroseptal, nferoseptal   walls; hypokinesis of the anterior wall. Doppler parameters are   consistent with abnormal left ventricular relaxation (grade 1   diastolic dysfunction). - Mitral valve: There was mild regurgitation. - Right ventricle: The cavity size was mildly dilated. Systolic   function was mildly reduced.  Assessment & Plan    46 yo female with PMH of HTN, and anxiety who presented with sudden onset dyspnea. Found to have new onset  HF.   1. Acute combined HF: Presented with sudden onset of dyspnea requiring the use of Bipap. CXR showed edema with bilateral pleural effusions. BNP 1729. Has been diuresed with IV lasix, now on RA. Weight trending down. Overall much improved.  -- would add low dose coreg 3.125mg  BID -- given new onset, with RF of HTN and family hx will need to r/o ischemia. Will plan for cardiac cath on Monday.  -- The patient understands that risks included but are not limited to stroke (1 in 1000), death (1 in 1000), kidney failure [usually temporary] (1 in 500), bleeding (1 in 200), allergic reaction [possibly serious] (1 in 200).  -- check lipids   2. HTN: Improved since time of admission. Given new HF would add coreg as above. No ACEi at this time given mildly elevated Cr.   3. PNA?: Primary following. Elevated Procalcitonin on admission, now trending down. Management per primary  4. Pre-DM: Hgb A1c 5.6. On SSI while inpatient  5. AKI?: Unknown baseline. 1.6>>1.32 today.  -- follow BMET  6. Hypokalemia: Replaced today  Signed, Laverda Page, NP-C Pager [phone] 05/29/2017, 3:15 PM As above, patient seen and examined. Briefly she is a 46 year old female with past medical history of hypertension admitted with acute systolic  congestive heart failure for further evaluation. No prior cardiac history. On July 4 she developed sudden onset of acute dyspnea. No chest pain. She presented to the emergency room and was found to be in congestive heart failure and hypertensive. She has been diuresed with improvement in blood pressure medications have been adjusted. Echocardiogram shows ejection fraction 30-35% and cardiology asked to evaluate. Troponin minimally elevated. Electrocardiogram shows sinus tachycardia with left bundle branch block.  1 acute systolic congestive heart failure-patient's volume status has improved. Continue present dose of IV Lasix at 40 mg twice a day. Follow renal function closely. We may be able to decrease Lasix dose tomorrow morning. Follow renal function.  2 cardiomyopathy-possibly hypertensive mediated. However ischemia needs to be excluded particularly in setting of flash pulmonary edema. We will plan cardiac catheterization on Monday. The risks and benefits including myocardial infarction, CVA and death discussed and she agrees to proceed. Would hold Lasix Sunday and Monday morning to decrease risk of contrast nephropathy. Limit dye. Follow renal function closely after procedure. We will continue with low-dose carvedilol. We will add an ARB following catheterization when renal function stable. Titrate medications as tolerated by pulse and blood pressure.  3 hypertension-blood pressure has improved. Continue amlodipine, hydralazine and carvedilol. Once catheterization complete would discontinue amlodipine and add ARB. Increase ARB and carvedilol as tolerated by pulse and blood pressure.  4 acute kidney disease-creatinine mildly increased. Follow closely with diuresis.  Olga Millers, MD

## 2017-05-30 LAB — COMPREHENSIVE METABOLIC PANEL WITH GFR
ALT: 42 U/L (ref 14–54)
AST: 33 U/L (ref 15–41)
Albumin: 3.7 g/dL (ref 3.5–5.0)
Alkaline Phosphatase: 76 U/L (ref 38–126)
Anion gap: 12 (ref 5–15)
BUN: 15 mg/dL (ref 6–20)
CO2: 27 mmol/L (ref 22–32)
Calcium: 9.4 mg/dL (ref 8.9–10.3)
Chloride: 98 mmol/L — ABNORMAL LOW (ref 101–111)
Creatinine, Ser: 1.35 mg/dL — ABNORMAL HIGH (ref 0.44–1.00)
GFR calc Af Amer: 53 mL/min — ABNORMAL LOW
GFR calc non Af Amer: 46 mL/min — ABNORMAL LOW
Glucose, Bld: 105 mg/dL — ABNORMAL HIGH (ref 65–99)
Potassium: 3.7 mmol/L (ref 3.5–5.1)
Sodium: 137 mmol/L (ref 135–145)
Total Bilirubin: 0.7 mg/dL (ref 0.3–1.2)
Total Protein: 6.8 g/dL (ref 6.5–8.1)

## 2017-05-30 LAB — GLUCOSE, CAPILLARY
GLUCOSE-CAPILLARY: 92 mg/dL (ref 65–99)
Glucose-Capillary: 139 mg/dL — ABNORMAL HIGH (ref 65–99)
Glucose-Capillary: 89 mg/dL (ref 65–99)
Glucose-Capillary: 109 mg/dL — ABNORMAL HIGH (ref 65–99)

## 2017-05-30 LAB — LEGIONELLA PNEUMOPHILA SEROGP 1 UR AG: L. PNEUMOPHILA SEROGP 1 UR AG: NEGATIVE

## 2017-05-30 LAB — CBC
HCT: 40.4 % (ref 36.0–46.0)
Hemoglobin: 13 g/dL (ref 12.0–15.0)
MCH: 28.6 pg (ref 26.0–34.0)
MCHC: 32.2 g/dL (ref 30.0–36.0)
MCV: 89 fL (ref 78.0–100.0)
Platelets: 399 K/uL (ref 150–400)
RBC: 4.54 MIL/uL (ref 3.87–5.11)
RDW: 14.3 % (ref 11.5–15.5)
WBC: 10.7 K/uL — ABNORMAL HIGH (ref 4.0–10.5)

## 2017-05-30 LAB — LIPID PANEL
CHOL/HDL RATIO: 3.8 ratio
Cholesterol: 161 mg/dL (ref 0–200)
HDL: 42 mg/dL (ref >40)
LDL Cholesterol: 92 mg/dL (ref 0–99)
TRIGLYCERIDES: 133 mg/dL (ref <150)
VLDL: 27 mg/dL (ref 0–40)

## 2017-05-30 MED ORDER — HYDRALAZINE HCL 50 MG PO TABS
50.0000 mg | ORAL_TABLET | Freq: Three times a day (TID) | ORAL | Status: DC
  Administered 2017-05-30 – 2017-06-02 (×9): 50 mg via ORAL
  Filled 2017-05-30 (×9): qty 1

## 2017-05-30 NOTE — Progress Notes (Signed)
Subjective:  She was admitted with acute onset of dyspnea and found to have a cardiomyopathy of undetermined etiology.  Blood pressure is still mildly increased.  Her breathing is better today and she has no chest pain.  Troponins mildly elevated.  Objective:  Vital Signs in the last 24 hours: BP (!) 138/92   Pulse 82   Temp 97.8 F (36.6 C) (Oral)   Resp 18   Ht 4\' 8"  (1.422 m)   Wt 72.6 kg (160 lb)   SpO2 100%   BMI 35.87 kg/m   Physical Exam: Pleasant black female in no acute distress Lungs:  Clear Cardiac:  Regular rhythm, normal S1 and S2, no S3 Extremities:  No edema present  Intake/Output from previous day: 07/06 0701 - 07/07 0700 In: 360 [P.O.:360] Out: 1600 [Urine:1600]  Weight Filed Weights   05/28/17 0250 05/29/17 0640 05/30/17 0500  Weight: 73.7 kg (162 lb 7.7 oz) 73.2 kg (161 lb 6.4 oz) 72.6 kg (160 lb)    Lab Results: Basic Metabolic Panel:  Recent Labs  07/68/08 2332 05/30/17 0319  NA 135 137  K 2.8* 3.7  CL 97* 98*  CO2 27 27  GLUCOSE 133* 105*  BUN 11 15  CREATININE 1.32* 1.35*   CBC:  Recent Labs  05/27/17 1935  05/28/17 2332 05/30/17 0319  WBC 16.9*  < > 12.6* 10.7*  NEUTROABS 9.1*  --   --   --   HGB 11.5*  < > 13.1 13.0  HCT 36.1  < > 39.8 40.4  MCV 91.4  < > 88.2 89.0  PLT 460*  < > 387 399  < > = values in this interval not displayed. Cardiac Enzymes: Troponin (Point of Care Test)  Recent Labs  05/27/17 2246  TROPIPOC 0.14*   Cardiac Panel (last 3 results)  Recent Labs  05/28/17 0250 05/28/17 0755 05/28/17 1502  TROPONINI 0.24* 0.22* 0.17*    Telemetry: Sinus rhythm with bundle branch block pattern, personally reviewed  Assessment/Plan:  1.  Acute systolic congestive heart failure clinically improved 2.  Left bundle branch block 3.  Cardiomyopathy of undetermined type 4.  Hypertensive heart disease 5.  Troponin elevation of undetermined arch and somewhat flat  Recommendations:  The plan is for her to  have catheterization on Monday and she had this explained to her yesterday.  We'll watch renal function carefully.  Add hydralazine to help with blood pressure control.     Darden Palmer  MD [facility] Cardiology  05/30/2017, 10:26 AM

## 2017-05-30 NOTE — Progress Notes (Signed)
[facility] PROGRESS NOTE  Lindsey Singh ZOX:[id number] DOB: [birth year] DOA: 05/27/2017   PCP: Patient, No Pcp Per     Assessment/Plan: Principal Problem:   Acute respiratory failure with hypoxia (HCC) Active Problems:   Acute pulmonary edema (HCC)   Hypertensive urgency   Acute renal failure (ARF) (HCC)   Fever   Hyperglycemia   Acute respiratory failure (HCC)    Lindsey Singh is a 46 y.o. female with history of hypertension visiting family from [place] who presented with one-day history of shortness of breath and fever associated with 3 weeks history of productive cough, and noted to be severely hypertensive with blood pressure more than 200 mmHg systolic. Chest x-ray was c/w CHF/Pneumonia, with leukocytosis and elevated BNP of 1700.  Admitted for hypoxic respiratory failure due to acute pulmonary edema and pneumonia  Assessment and plan  #1 Acute hypoxic respiratory failure:  Initial ABG - PH 7.29, PCO2 42, PO2 74  Due to pulmonary edema and pneumonia BiPAP support briefly Currently in O2 by [place] Diuretics with monitoring of electrolytes and renal function Antibiotic coverage for pneumonia  #2 Acute CHF with Systolic Dysfunction, new onset: 2-D echo 05/29/2017 -EF 30-35%, severe hypokinesis/akinesis, grade 1 diastolic dysfunction Supportive care ACE inhibitor/ARB hold for now due to renal dysfunction LDL 92 A1c 5.6% Cardiac catheterization planned for Monday, 06/01/2017 Cardiology consulted  #3 presumptive acquired pneumonia: Patient presents with fever, leukocytosis with history of cough Current antibiotics Urine Hg Antigen negative Follow-up on cultures- blood, sputum  #4 acute renal failure: Peak creatinine on admission 1.63 Due to hemodynamic changes related to CHF Improving  #5 normocytic anemia: Stable No evidence of ongoing bleeding. May be related to possible CKD  #6 hypertension: Treated initially with nitroglycerin infusion  hypertensive urgency which is currently resolved Continue antihypertensive regimen  #7 hyperglycemia: A1c 5.6% No clinical diabetes mellitus Sliding-scale insulin as needed Outpatient follow-up    DVT prophylaxsis Lovenox  Code Status:  Full code   Family Communication: Discussed in detail with the patient, all imaging results, lab results explained to the patient   Disposition Plan:   2-3 days      Consultants:   cardiology  Procedures: Cardiac cath Monday, 06/01/2017  Antibiotics: Anti-infectives    Start     Dose/Rate Route Frequency Ordered Stop   05/29/17 2200  azithromycin (ZITHROMAX) tablet 500 mg     500 mg Oral Daily at bedtime 05/29/17 1247 06/04/17 2159   05/28/17 2000  azithromycin (ZITHROMAX) 500 mg in dextrose 5 % 250 mL IVPB  Status:  Discontinued     500 mg 250 mL/hr over 60 Minutes Intravenous Every 24 hours 05/27/17 2237 05/29/17 1247   05/28/17 1800  cefTRIAXone (ROCEPHIN) 1 g in dextrose 5 % 50 mL IVPB     1 g 100 mL/hr over 30 Minutes Intravenous Every 24 hours 05/27/17 2237 06/04/17 1759   05/27/17 2015  cefTRIAXone (ROCEPHIN) 1 g in dextrose 5 % 50 mL IVPB     1 g 100 mL/hr over 30 Minutes Intravenous  Once 05/27/17 2006 05/27/17 2051   05/27/17 2015  azithromycin (ZITHROMAX) 500 mg in dextrose 5 % 250 mL IVPB     500 mg 250 mL/hr over 60 Minutes Intravenous  Once 05/27/17 2006 05/27/17 2204         HPI/Subjective:  No acute events reported overnight. Patient's breathing better. No fever or chills.  Objective: Vitals:   05/30/17 0500 05/30/17 8119 05/30/17 0827  05/30/17 0833  BP:  (!) 145/91 (!) 138/92   Pulse:  77  82  Resp:  18    Temp:  97.8 F (36.6 C)    TempSrc:  Oral    SpO2:  100%    Weight: 72.6 kg (160 lb)     Height:        Intake/Output Summary (Last 24 hours) at 05/30/17 1007 Last data filed at 05/30/17 0800  Gross per 24 hour  Intake              120 ml  Output             1500 ml  Net            -1380 ml     Exam:  Examination:  General exam: Comfortable, in no acute distress Respiratory system: Clear to auscultation. Respiratory effort normal. Cardiovascular system: S1 & S2 heard, RRR. No JVD, murmurs, rubs, gallops or clicks. No pedal edema. Gastrointestinal system: Abdomen is nondistended, soft and nontender. No organomegaly or masses felt. Normal bowel sounds heard. Central nervous system: Alert and oriented. No focal neurological deficits. Extremities: Symmetric 5 x 5 power. Skin: No rashes, lesions or ulcers Psychiatry: Judgement and insight appear normal. Mood & affect appropriate.     Data Reviewed: I have personally reviewed following labs and imaging studies  Micro Results Recent Results (from the past 240 hour(s))  Blood culture (routine x 2)     Status: None (Preliminary result)   Collection Time: 05/27/17  7:52 PM  Result Value Ref Range Status   Specimen Description BLOOD RIGHT ARM  Final   Special Requests   Final    BOTTLES DRAWN AEROBIC AND ANAEROBIC Blood Culture adequate volume   Culture NO GROWTH 2 DAYS  Final   Report Status PENDING  Incomplete  Blood culture (routine x 2)     Status: None (Preliminary result)   Collection Time: 05/27/17  7:59 PM  Result Value Ref Range Status   Specimen Description BLOOD LEFT HAND  Final   Special Requests   Final    BOTTLES DRAWN AEROBIC AND ANAEROBIC Blood Culture adequate volume   Culture NO GROWTH 2 DAYS  Final   Report Status PENDING  Incomplete  MRSA PCR Screening     Status: None   Collection Time: 05/28/17  2:46 AM  Result Value Ref Range Status   MRSA by PCR NEGATIVE NEGATIVE Final    Comment:        The GeneXpert MRSA Assay (FDA approved for NASAL specimens only), is one component of a comprehensive MRSA colonization surveillance program. It is not intended to diagnose MRSA infection nor to guide or monitor treatment for MRSA infections.     Radiology Reports Ct Head Wo Contrast  Result Date:  05/29/2017 CLINICAL DATA:  Fall the EXAM: CT HEAD WITHOUT CONTRAST TECHNIQUE: Contiguous axial images were obtained from the base of the skull through the vertex without intravenous contrast. COMPARISON:  None. FINDINGS: Brain: No mass lesion, intraparenchymal hemorrhage or extra-axial collection. No evidence of acute cortical infarct. Brain parenchyma and CSF-containing spaces are normal for age. Vascular: No hyperdense vessel or unexpected calcification. Skull: Normal visualized skull base, calvarium and extracranial soft tissues. Sinuses/Orbits: No sinus fluid levels or advanced mucosal thickening. No mastoid effusion. Normal orbits. IMPRESSION: Normal head CT. Electronically Signed   By: Deatra Robinson M.D.   On: 05/29/2017 01:19   Dg Chest Portable 1 View  Result Date: 05/27/2017 CLINICAL DATA:  Sudden onset shortness of breath. EXAM: PORTABLE CHEST 1 VIEW COMPARISON:  None. FINDINGS: 1934 hours. Cardiopericardial silhouette is at upper limits of normal for size. Vascular congestion with probable interstitial pulmonary edema. Small bilateral pleural effusions suspected. The visualized bony structures of the thorax are intact. Telemetry leads overlie the chest. IMPRESSION: Low volume film with borderline to mild cardiomegaly, interstitial pulmonary edema pattern and small bilateral pleural effusions. Electronically Signed   By: Kennith Center M.D.   On: 05/27/2017 19:49     CBC  Recent Labs Lab 05/27/17 1935 05/28/17 0250 05/28/17 0755 05/28/17 2332 05/30/17 0319  WBC 16.9* 19.9* 17.2* 12.6* 10.7*  HGB 11.5* 12.3 12.5 13.1 13.0  HCT 36.1 37.4 38.3 39.8 40.4  PLT 460* 415* 430* 387 399  MCV 91.4 88.4 88.7 88.2 89.0  MCH 29.1 29.1 28.9 29.0 28.6  MCHC 31.9 32.9 32.6 32.9 32.2  RDW 14.1 14.0 14.0 14.1 14.3  LYMPHSABS 6.8*  --   --   --   --   MONOABS 0.7  --   --   --   --   EOSABS 0.3  --   --   --   --   BASOSABS 0.0  --   --   --   --     Chemistries   Recent Labs Lab 05/27/17 1935  05/28/17 0250 05/28/17 0755 05/28/17 1053 05/28/17 2332 05/30/17 0319  NA 137  --  141 139 135 137  K 3.7  --  3.1* 3.0* 2.8* 3.7  CL 105  --  102 101 97* 98*  CO2 16*  --  27 26 27 27   GLUCOSE 337*  --  112* 108* 133* 105*  BUN 10  --  10 12 11 15   CREATININE 1.63* 1.30* 1.29* 1.34* 1.32* 1.35*  CALCIUM 8.6*  --  9.1 9.2 9.4 9.4  MG  --  1.8  --   --   --   --   AST 55*  --   --  53*  --  33  ALT 51  --   --  56*  --  42  ALKPHOS 83  --   --  82  --  76  BILITOT 1.3*  --   --  1.1  --  0.7   ------------------------------------------------------------------------------------------------------------------ estimated creatinine clearance is 40.9 mL/min (A) (by C-G formula based on SCr of 1.35 mg/dL (H)). ------------------------------------------------------------------------------------------------------------------  Recent Labs  05/28/17 0250  HGBA1C 5.6   ------------------------------------------------------------------------------------------------------------------  Recent Labs  05/30/17 0319  CHOL 161  HDL 42  LDLCALC 92  TRIG 133  CHOLHDL 3.8   ------------------------------------------------------------------------------------------------------------------  Recent Labs  05/27/17 2038  TSH 1.205   ------------------------------------------------------------------------------------------------------------------ No results for input(s): VITAMINB12, FOLATE, FERRITIN, TIBC, IRON, RETICCTPCT in the last 72 hours.  Coagulation profile No results for input(s): INR, PROTIME in the last 168 hours.  No results for input(s): DDIMER in the last 72 hours.  Cardiac Enzymes  Recent Labs Lab 05/28/17 0250 05/28/17 0755 05/28/17 1502  TROPONINI 0.24* 0.22* 0.17*   ------------------------------------------------------------------------------------------------------------------ Invalid input(s): POCBNP   CBG:  Recent Labs Lab 05/29/17 0819 05/29/17 1148  05/29/17 1701 05/29/17 2137 05/30/17 0757  GLUCAP 89 107* 115* 115* 139*       Studies: Ct Head Wo Contrast  Result Date: 05/29/2017 CLINICAL DATA:  Fall the EXAM: CT HEAD WITHOUT CONTRAST TECHNIQUE: Contiguous axial images were obtained from the base of the skull through the vertex without intravenous contrast. COMPARISON:  None. FINDINGS: Brain: No  mass lesion, intraparenchymal hemorrhage or extra-axial collection. No evidence of acute cortical infarct. Brain parenchyma and CSF-containing spaces are normal for age. Vascular: No hyperdense vessel or unexpected calcification. Skull: Normal visualized skull base, calvarium and extracranial soft tissues. Sinuses/Orbits: No sinus fluid levels or advanced mucosal thickening. No mastoid effusion. Normal orbits. IMPRESSION: Normal head CT. Electronically Signed   By: Deatra Robinson M.D.   On: 05/29/2017 01:19      Lab Results  Component Value Date   HGBA1C 5.6 05/28/2017   Lab Results  Component Value Date   LDLCALC 92 05/30/2017   CREATININE 1.35 (H) 05/30/2017       Scheduled Meds: . amLODipine  10 mg Oral Daily  . aspirin EC  81 mg Oral Daily  . azithromycin  500 mg Oral QHS  . carvedilol  3.125 mg Oral BID WC  . enoxaparin (LOVENOX) injection  40 mg Subcutaneous Q24H  . furosemide  40 mg Intravenous Q12H  . gabapentin  200 mg Oral QHS  . hydrALAZINE  25 mg Oral Q8H  . insulin aspart  0-9 Units Subcutaneous TID WC  . venlafaxine XR  150 mg Oral Q breakfast   Continuous Infusions: . cefTRIAXone (ROCEPHIN)  IV 1 g (05/29/17 1758)     LOS: 3 days    Time spent: 22 MINS    Singh,Lindsey Mierzwa  [facility] Pager [phone]. If 7PM-7AM, please contact night-coverage at [email or web address], password [facility] 05/30/2017, 10:07 AM  LOS: 3 days

## 2017-05-31 DIAGNOSIS — N179 Acute kidney failure, unspecified: Secondary | ICD-10-CM

## 2017-05-31 LAB — BASIC METABOLIC PANEL
ANION GAP: 11 (ref 5–15)
BUN: 23 mg/dL — ABNORMAL HIGH (ref 6–20)
CO2: 26 mmol/L (ref 22–32)
CREATININE: 1.49 mg/dL — AB (ref 0.44–1.00)
Calcium: 10 mg/dL (ref 8.9–10.3)
Chloride: 99 mmol/L — ABNORMAL LOW (ref 101–111)
GFR, EST AFRICAN AMERICAN: 47 mL/min — AB (ref >60)
GFR, EST NON AFRICAN AMERICAN: 40 mL/min — AB (ref >60)
Glucose, Bld: 103 mg/dL — ABNORMAL HIGH (ref 65–99)
Potassium: 4.1 mmol/L (ref 3.5–5.1)
Sodium: 136 mmol/L (ref 135–145)

## 2017-05-31 LAB — GLUCOSE, CAPILLARY
Glucose-Capillary: 106 mg/dL — ABNORMAL HIGH (ref 65–99)
Glucose-Capillary: 89 mg/dL (ref 65–99)
Glucose-Capillary: 102 mg/dL — ABNORMAL HIGH (ref 65–99)

## 2017-05-31 LAB — PROCALCITONIN: PROCALCITONIN: 2.01 ng/mL

## 2017-05-31 MED ORDER — SODIUM CHLORIDE 0.9 % IV SOLN
INTRAVENOUS | Status: DC
  Administered 2017-06-01: 10:00:00 via INTRAVENOUS

## 2017-05-31 MED ORDER — ASPIRIN 81 MG PO CHEW: 81.0000 mg | CHEWABLE_TABLET | ORAL | Status: DC

## 2017-05-31 MED ORDER — WHITE PETROLATUM GEL
Status: AC
  Administered 2017-05-31: 21:00:00
  Filled 2017-05-31: qty 1

## 2017-05-31 MED ORDER — SODIUM CHLORIDE 0.9 % IV SOLN: 250.0000 mL | INTRAVENOUS | Status: DC | PRN

## 2017-05-31 MED ORDER — ALUM & MAG HYDROXIDE-SIMETH 200-200-20 MG/5ML PO SUSP
30.0000 mL | Freq: Four times a day (QID) | ORAL | Status: DC | PRN
Start: 2017-05-31 — End: 2017-06-02
  Administered 2017-05-31: 30 mL via ORAL
  Filled 2017-05-31: qty 30

## 2017-05-31 MED ORDER — SODIUM CHLORIDE 0.9% FLUSH: 3.0000 mL | INTRAVENOUS | Status: DC | PRN

## 2017-05-31 MED ORDER — FUROSEMIDE 10 MG/ML IJ SOLN: 40.0000 mg | Freq: Every day | INTRAMUSCULAR | Status: DC

## 2017-05-31 MED ORDER — SODIUM CHLORIDE 0.9% FLUSH
3.0000 mL | Freq: Two times a day (BID) | INTRAVENOUS | Status: DC
  Administered 2017-06-01: 3 mL via INTRAVENOUS

## 2017-05-31 NOTE — Progress Notes (Addendum)
Occupational Therapy Treatment/Discharge Patient Details Name: Lindsey Singh MRN: [id number] DOB: 03-22-1971 Today's Date: 05/31/2017    History of present illness Pt is a 46 y.o. female who presented to the ED after developing shortness of breath while trying to visit the [place] parade. She was found to be markedly hypertensive with acute CHF with systolic dysfunction, acquired pneumonia, and acute renal failure. PMH significant for anxiety and hypertension. Plan for heart cath Monday 06/01/17.   OT comments  Pt able to complete all ADL with increased time in hospital setting during OT evaluation with no shortness of breath during participation. She demonstrates good understanding of activity progression and energy conservation strategies post-acute D/C. Educated pt on home set-up for safety while recovering. At this time, pt is functioning at baseline and has no further acute OT needs. All education complete and OT will sign off. Please re-order if needs change.    Follow Up Recommendations  No OT follow up;Supervision - Intermittent    Equipment Recommendations  None recommended by OT       Precautions / Restrictions Precautions Precautions: Fall Restrictions Weight Bearing Restrictions: No       Mobility Bed Mobility Overal bed mobility: Independent                Transfers Overall transfer level: Modified independent               General transfer comment: Increased time    Balance Overall balance assessment: No apparent balance deficits (not formally assessed)                                         ADL either performed or assessed with clinical judgement   ADL Overall ADL's : Modified independent (increased time)                                       General ADL Comments: No difficulties     Vision Baseline Vision/History: Wears glasses Wears Glasses: At all times Patient Visual Report: No change from  baseline Vision Assessment?: No apparent visual deficits Additional Comments: Pt able to utilize vision functionally throughout evaluation.    Perception     Praxis      Cognition Arousal/Alertness: Awake/alert Behavior During Therapy: WFL for tasks assessed/performed Overall Cognitive Status: Within Functional Limits for tasks assessed                                          Exercises     Shoulder Instructions       General Comments      Pertinent Vitals/ Pain       Pain Assessment: No/denies pain  Home Living Family/patient expects to be discharged to:: Private residence Living Arrangements: Parent;Other relatives Available Help at Discharge: Family;Available 24 hours/day (visiting here from [facility]) Type of Home: House Home Access: Level entry     Home Layout: Two level Alternate Level Stairs-Number of Steps: flight Alternate Level Stairs-Rails: Right;Left;Can reach both Bathroom Shower/Tub: Chief Strategy Officer: Standard     Home Equipment: Environmental consultant - 2 wheels;Walker - 4 wheels;Bedside commode;Shower seat   Additional Comments: parents have a lot of equipment; pt does not use  equipment      Prior Functioning/Environment Level of Independence: Independent        Comments: Pt had come here to help her sister on dialysis and her parents.    Frequency           Progress Toward Goals  OT Goals(current goals can now be found in the care plan section)     Acute Rehab OT Goals Patient Stated Goal: to get better OT Goal Formulation: With patient/family Time For Goal Achievement: 06/14/17 Potential to Achieve Goals: Good  Plan      Co-evaluation                 AM-PAC PT "6 Clicks" Daily Activity     Outcome Measure   Help from another person eating meals?: None Help from another person taking care of personal grooming?: None Help from another person toileting, which includes using toliet, bedpan, or urinal?:  None Help from another person bathing (including washing, rinsing, drying)?: None Help from another person to put on and taking off regular upper body clothing?: None Help from another person to put on and taking off regular lower body clothing?: None 6 Click Score: 24    End of Session    OT Visit Diagnosis: Muscle weakness (generalized) (M62.81)   Activity Tolerance Patient tolerated treatment well   Patient Left in chair;with call bell/phone within reach;with family/visitor present   Nurse Communication Other (comment) (Can pt have graham crackers?)        Time: 1610-9604 OT Time Calculation (min): 11 min  Charges: OT General Charges $OT Visit: 1 Procedure OT Evaluation $OT Eval Low Complexity: 1 Procedure  Doristine Section, MS OTR/L  Pager: [phone]    Lindsey Singh 05/31/2017, 1:39 PM

## 2017-05-31 NOTE — Progress Notes (Signed)
[facility] PROGRESS NOTE  Lindsey Singh ZOX:[id number] DOB: 08-Jul-1971 DOA: 05/27/2017   PCP: Patient, No Pcp Per     Assessment/Plan: Principal Problem:   Acute respiratory failure with hypoxia (HCC) Active Problems:   Acute pulmonary edema (HCC)   Hypertensive urgency   Acute renal failure (ARF) (HCC)   Fever   Hyperglycemia   Acute respiratory failure (HCC)    Lindsey Singh is a 46 y.o. female with history of hypertension visiting family from [place] who presented with one-day history of shortness of breath and fever associated with 3 weeks history of productive cough, and noted to be severely hypertensive with blood pressure more than 200 mmHg systolic. Chest x-ray was c/w CHF/Pneumonia, with leukocytosis and elevated BNP of 1700.  Admitted for hypoxic respiratory failure due to acute pulmonary edema and pneumonia  Assessment and plan  #1 Acute hypoxic respiratory failure:  Resolved Initial ABG - PH 7.29, PCO2 42, PO2 74 Required brief BiPAP  Due to pulmonary edema/ pneumonia Currently on RA  #2 Acute CHF with Systolic Dysfunction, new onset: 2-D echo 05/29/2017 -EF 30-35%, severe hypokinesis/akinesis, grade 1 diastolic dysfunction Supportive care ACE inhibitor/ARB hold for now due to renal dysfunction LDL 92 A1c 5.6% Cardiac catheterization planned for Monday, 06/01/2017 Cardiology consulted  #3 presumptive acquired pneumonia: Patient presented with fever, leukocytosis with history of cough Leukocytosis resolving Cont antibiotics Urine Hg Antigen negative Follow-up on cultures- blood, sputum  #4 acute renal failure: Peak creatinine on admission 1.63 Due to hemodynamic changes related to CHF  #5 normocytic anemia: Stable No evidence of ongoing bleeding. May be related to possible CKD  #6 hypertension: Treated initially with nitroglycerin infusion hypertensive urgency which is currently resolved Continue antihypertensive  regimen  #7 hyperglycemia: A1c 5.6% No clinical diabetes mellitus Sliding-scale insulin as needed Outpatient follow-up    DVT prophylaxsis Lovenox  Code Status:  Full code   Family Communication: Discussed in detail with the patient, all imaging results, lab results explained to the patient   Disposition Plan:   2-3 days      Consultants:   cardiology  Procedures: Cardiac cath Monday, 06/01/2017  Antibiotics: Anti-infectives    Start     Dose/Rate Route Frequency Ordered Stop   05/29/17 2200  azithromycin (ZITHROMAX) tablet 500 mg     500 mg Oral Daily at bedtime 05/29/17 1247 06/04/17 2159   05/28/17 2000  azithromycin (ZITHROMAX) 500 mg in dextrose 5 % 250 mL IVPB  Status:  Discontinued     500 mg 250 mL/hr over 60 Minutes Intravenous Every 24 hours 05/27/17 2237 05/29/17 1247   05/28/17 1800  cefTRIAXone (ROCEPHIN) 1 g in dextrose 5 % 50 mL IVPB     1 g 100 mL/hr over 30 Minutes Intravenous Every 24 hours 05/27/17 2237 06/04/17 1759   05/27/17 2015  cefTRIAXone (ROCEPHIN) 1 g in dextrose 5 % 50 mL IVPB     1 g 100 mL/hr over 30 Minutes Intravenous  Once 05/27/17 2006 05/27/17 2051   05/27/17 2015  azithromycin (ZITHROMAX) 500 mg in dextrose 5 % 250 mL IVPB     500 mg 250 mL/hr over 60 Minutes Intravenous  Once 05/27/17 2006 05/27/17 2204         HPI/Subjective:  No acute events reported overnight. Patient's breathing better. No fever or chills.  Objective: Vitals:   05/30/17 1806 05/30/17 2056 05/31/17 0619 05/31/17 0853  BP: 124/76 129/78 134/74 128/68  Pulse: 88 84 84  88  Resp:  18 17   Temp:  98.6 F (37 C) 98 F (36.7 C)   TempSrc:  Oral Oral   SpO2:  99% 98%   Weight:   70.8 kg (156 lb)   Height:        Intake/Output Summary (Last 24 hours) at 05/31/17 0859 Last data filed at 05/31/17 0454  Gross per 24 hour  Intake              500 ml  Output             2000 ml  Net            -1500 ml    Exam:  Examination:  General exam:  Comfortable, in no acute distress Respiratory system: Clear to auscultation. Respiratory effort normal. Cardiovascular system: S1 & S2 heard, RRR. No JVD, murmurs, rubs, gallops or clicks. No pedal edema. Gastrointestinal system: Abdomen is nondistended, soft and nontender. No organomegaly or masses felt. Normal bowel sounds heard. Central nervous system: Alert and oriented. No focal neurological deficits. Extremities: Symmetric 5 x 5 power. Skin: No rashes, lesions or ulcers Psychiatry: Judgement and insight appear normal. Mood & affect appropriate.     Data Reviewed: I have personally reviewed following labs and imaging studies  Micro Results Recent Results (from the past 240 hour(s))  Blood culture (routine x 2)     Status: None (Preliminary result)   Collection Time: 05/27/17  7:52 PM  Result Value Ref Range Status   Specimen Description BLOOD RIGHT ARM  Final   Special Requests   Final    BOTTLES DRAWN AEROBIC AND ANAEROBIC Blood Culture adequate volume   Culture NO GROWTH 3 DAYS  Final   Report Status PENDING  Incomplete  Blood culture (routine x 2)     Status: None (Preliminary result)   Collection Time: 05/27/17  7:59 PM  Result Value Ref Range Status   Specimen Description BLOOD LEFT HAND  Final   Special Requests   Final    BOTTLES DRAWN AEROBIC AND ANAEROBIC Blood Culture adequate volume   Culture NO GROWTH 3 DAYS  Final   Report Status PENDING  Incomplete  MRSA PCR Screening     Status: None   Collection Time: 05/28/17  2:46 AM  Result Value Ref Range Status   MRSA by PCR NEGATIVE NEGATIVE Final    Comment:        The GeneXpert MRSA Assay (FDA approved for NASAL specimens only), is one component of a comprehensive MRSA colonization surveillance program. It is not intended to diagnose MRSA infection nor to guide or monitor treatment for MRSA infections.     Radiology Reports Ct Head Wo Contrast  Result Date: 05/29/2017 CLINICAL DATA:  Fall the EXAM: CT  HEAD WITHOUT CONTRAST TECHNIQUE: Contiguous axial images were obtained from the base of the skull through the vertex without intravenous contrast. COMPARISON:  None. FINDINGS: Brain: No mass lesion, intraparenchymal hemorrhage or extra-axial collection. No evidence of acute cortical infarct. Brain parenchyma and CSF-containing spaces are normal for age. Vascular: No hyperdense vessel or unexpected calcification. Skull: Normal visualized skull base, calvarium and extracranial soft tissues. Sinuses/Orbits: No sinus fluid levels or advanced mucosal thickening. No mastoid effusion. Normal orbits. IMPRESSION: Normal head CT. Electronically Signed   By: Deatra Robinson M.D.   On: 05/29/2017 01:19   Dg Chest Portable 1 View  Result Date: 05/27/2017 CLINICAL DATA:  Sudden onset shortness of breath. EXAM: PORTABLE CHEST 1 VIEW COMPARISON:  None. FINDINGS: 1934 hours. Cardiopericardial silhouette is at upper limits of normal for size. Vascular congestion with probable interstitial pulmonary edema. Small bilateral pleural effusions suspected. The visualized bony structures of the thorax are intact. Telemetry leads overlie the chest. IMPRESSION: Low volume film with borderline to mild cardiomegaly, interstitial pulmonary edema pattern and small bilateral pleural effusions. Electronically Signed   By: Kennith Center M.D.   On: 05/27/2017 19:49     CBC  Recent Labs Lab 05/27/17 1935 05/28/17 0250 05/28/17 0755 05/28/17 2332 05/30/17 0319  WBC 16.9* 19.9* 17.2* 12.6* 10.7*  HGB 11.5* 12.3 12.5 13.1 13.0  HCT 36.1 37.4 38.3 39.8 40.4  PLT 460* 415* 430* 387 399  MCV 91.4 88.4 88.7 88.2 89.0  MCH 29.1 29.1 28.9 29.0 28.6  MCHC 31.9 32.9 32.6 32.9 32.2  RDW 14.1 14.0 14.0 14.1 14.3  LYMPHSABS 6.8*  --   --   --   --   MONOABS 0.7  --   --   --   --   EOSABS 0.3  --   --   --   --   BASOSABS 0.0  --   --   --   --     Chemistries   Recent Labs Lab 05/27/17 1935 05/28/17 0250 05/28/17 0755 05/28/17 1053  05/28/17 2332 05/30/17 0319 05/31/17 0451  NA 137  --  141 139 135 137 136  K 3.7  --  3.1* 3.0* 2.8* 3.7 4.1  CL 105  --  102 101 97* 98* 99*  CO2 16*  --  27 26 27 27 26   GLUCOSE 337*  --  112* 108* 133* 105* 103*  BUN 10  --  10 12 11 15  23*  CREATININE 1.63* 1.30* 1.29* 1.34* 1.32* 1.35* 1.49*  CALCIUM 8.6*  --  9.1 9.2 9.4 9.4 10.0  MG  --  1.8  --   --   --   --   --   AST 55*  --   --  53*  --  33  --   ALT 51  --   --  56*  --  42  --   ALKPHOS 83  --   --  82  --  76  --   BILITOT 1.3*  --   --  1.1  --  0.7  --    ------------------------------------------------------------------------------------------------------------------ estimated creatinine clearance is 36.5 mL/min (A) (by C-G formula based on SCr of 1.49 mg/dL (H)). ------------------------------------------------------------------------------------------------------------------ No results for input(s): HGBA1C in the last 72 hours. ------------------------------------------------------------------------------------------------------------------  Recent Labs  05/30/17 0319  CHOL 161  HDL 42  LDLCALC 92  TRIG 133  CHOLHDL 3.8   ------------------------------------------------------------------------------------------------------------------ No results for input(s): TSH, T4TOTAL, T3FREE, THYROIDAB in the last 72 hours.  Invalid input(s): FREET3 ------------------------------------------------------------------------------------------------------------------ No results for input(s): VITAMINB12, FOLATE, FERRITIN, TIBC, IRON, RETICCTPCT in the last 72 hours.  Coagulation profile No results for input(s): INR, PROTIME in the last 168 hours.  No results for input(s): DDIMER in the last 72 hours.  Cardiac Enzymes  Recent Labs Lab 05/28/17 0250 05/28/17 0755 05/28/17 1502  TROPONINI 0.24* 0.22* 0.17*    ------------------------------------------------------------------------------------------------------------------ Invalid input(s): POCBNP   CBG:  Recent Labs Lab 05/30/17 0757 05/30/17 1150 05/30/17 1655 05/30/17 2054 05/31/17 0838  GLUCAP 139* 92 89 109* 106*       Studies: No results found.    Lab Results  Component Value Date   HGBA1C 5.6 05/28/2017   Lab Results  Component Value  Date   LDLCALC 92 05/30/2017   CREATININE 1.49 (H) 05/31/2017       Scheduled Meds: . amLODipine  10 mg Oral Daily  . aspirin EC  81 mg Oral Daily  . azithromycin  500 mg Oral QHS  . carvedilol  3.125 mg Oral BID WC  . enoxaparin (LOVENOX) injection  40 mg Subcutaneous Q24H  . furosemide  40 mg Intravenous Q12H  . gabapentin  200 mg Oral QHS  . hydrALAZINE  50 mg Oral Q8H  . insulin aspart  0-9 Units Subcutaneous TID WC  . venlafaxine XR  150 mg Oral Q breakfast   Continuous Infusions: . cefTRIAXone (ROCEPHIN)  IV Stopped (05/30/17 1853)     LOS: 4 days    Time spent: 22 MINS    OSEI-BONSU,Yavonne Kiss  [facility] Pager [phone]. If 7PM-7AM, please contact night-coverage at [email or web address], password [facility] 05/31/2017, 8:59 AM  LOS: 4 days

## 2017-05-31 NOTE — Progress Notes (Signed)
Subjective:  No complaints of shortness of breath and sitting quietly at the bedside.  Blood pressure under better control now.  Some increase in her creatinine.  Objective:  Vital Signs in the last 24 hours: BP 128/68   Pulse 88   Temp 98 F (36.7 C) (Oral)   Resp 17   Ht 4\' 8"  (1.422 m)   Wt 70.8 kg (156 lb)   SpO2 98%   BMI 34.97 kg/m   Physical Exam: Pleasant black female in no acute distress Lungs:  Clear Cardiac:  Regular rhythm, normal S1 and S2, no S3 Extremities:  No edema present  Intake/Output from previous day: 07/07 0701 - 07/08 0700 In: 500 [P.O.:500] Out: 2500 [Urine:2500]  Weight Filed Weights   05/29/17 0640 05/30/17 0500 05/31/17 0619  Weight: 73.2 kg (161 lb 6.4 oz) 72.6 kg (160 lb) 70.8 kg (156 lb)    Lab Results: Basic Metabolic Panel:  Recent Labs  40/98/11 0319 05/31/17 0451  NA 137 136  K 3.7 4.1  CL 98* 99*  CO2 27 26  GLUCOSE 105* 103*  BUN 15 23*  CREATININE 1.35* 1.49*   CBC:  Recent Labs  05/28/17 2332 05/30/17 0319  WBC 12.6* 10.7*  HGB 13.1 13.0  HCT 39.8 40.4  MCV 88.2 89.0  PLT 387 399   Cardiac Panel (last 3 results)  Recent Labs  05/28/17 1502  TROPONINI 0.17*    Telemetry: Sinus rhythm with bundle branch block pattern, personally reviewed  Assessment/Plan:  1.  Acute systolic congestive heart failure clinically improved 2.  Left bundle branch block 3.  Cardiomyopathy of undetermined type 4.  Hypertensive heart disease 5.  Troponin elevation of undetermined origin and somewhat flat  Recommendations:  Catheterization is planned for tomorrow.  She has no questions about it.  Did ask for patient education information about heart failure and this was provided to her.  I'm going to hold her furosemide in light of the increasing creatinine in anticipation of cath.     Darden Palmer  MD [facility]  05/31/2017, 12:07 PM

## 2017-06-01 ENCOUNTER — Encounter (HOSPITAL_COMMUNITY): Payer: Self-pay | Admitting: Cardiovascular Disease

## 2017-06-01 ENCOUNTER — Encounter (HOSPITAL_COMMUNITY)
Admission: EM | Disposition: A | Discharge status: Home / Self Care | Payer: Self-pay | Source: Home / Self Care | Attending: Internal Medicine

## 2017-06-01 DIAGNOSIS — I5041 Acute combined systolic (congestive) and diastolic (congestive) heart failure: Secondary | ICD-10-CM

## 2017-06-01 DIAGNOSIS — J810 Acute pulmonary edema: Secondary | ICD-10-CM

## 2017-06-01 DIAGNOSIS — I447 Left bundle-branch block, unspecified: Secondary | ICD-10-CM

## 2017-06-01 HISTORY — PX: LEFT HEART CATH AND CORONARY ANGIOGRAPHY: CATH118249

## 2017-06-01 HISTORY — DX: Acute combined systolic (congestive) and diastolic (congestive) heart failure: I50.41

## 2017-06-01 LAB — CULTURE, BLOOD (ROUTINE X 2)
CULTURE: NO GROWTH
CULTURE: NO GROWTH
SPECIAL REQUESTS: ADEQUATE
Special Requests: ADEQUATE

## 2017-06-01 LAB — BASIC METABOLIC PANEL
Anion gap: 11 (ref 5–15)
BUN: 27 mg/dL — ABNORMAL HIGH (ref 6–20)
CHLORIDE: 99 mmol/L — AB (ref 101–111)
CO2: 26 mmol/L (ref 22–32)
CREATININE: 1.5 mg/dL — AB (ref 0.44–1.00)
Calcium: 9.8 mg/dL (ref 8.9–10.3)
GFR calc non Af Amer: 40 mL/min — ABNORMAL LOW (ref >60)
GFR, EST AFRICAN AMERICAN: 47 mL/min — AB (ref >60)
Glucose, Bld: 113 mg/dL — ABNORMAL HIGH (ref 65–99)
POTASSIUM: 4.1 mmol/L (ref 3.5–5.1)
Sodium: 136 mmol/L (ref 135–145)

## 2017-06-01 LAB — CBC
HEMATOCRIT: 40 % (ref 36.0–46.0)
Hemoglobin: 13.2 g/dL (ref 12.0–15.0)
MCH: 29.4 pg (ref 26.0–34.0)
MCHC: 33 g/dL (ref 30.0–36.0)
MCV: 89.1 fL (ref 78.0–100.0)
PLATELETS: 406 10*3/uL — AB (ref 150–400)
RBC: 4.49 MIL/uL (ref 3.87–5.11)
RDW: 14.3 % (ref 11.5–15.5)
WBC: 9 10*3/uL (ref 4.0–10.5)

## 2017-06-01 LAB — GLUCOSE, CAPILLARY
GLUCOSE-CAPILLARY: 100 mg/dL — AB (ref 65–99)
GLUCOSE-CAPILLARY: 116 mg/dL — AB (ref 65–99)
Glucose-Capillary: 94 mg/dL (ref 65–99)

## 2017-06-01 LAB — PROTIME-INR
INR: 1.01
Prothrombin Time: 13.3 seconds (ref 11.4–15.2)

## 2017-06-01 SURGERY — LEFT HEART CATH AND CORONARY ANGIOGRAPHY
Anesthesia: LOCAL

## 2017-06-01 MED ORDER — VERAPAMIL HCL 2.5 MG/ML IV SOLN
INTRAVENOUS | Status: DC | PRN
  Administered 2017-06-01: 12:00:00 via INTRA_ARTERIAL

## 2017-06-01 MED ORDER — AZITHROMYCIN 500 MG PO TABS
500.0000 mg | ORAL_TABLET | Freq: Every day | ORAL | Status: AC
  Administered 2017-06-01: 500 mg via ORAL
  Filled 2017-06-01: qty 1

## 2017-06-01 MED ORDER — CARVEDILOL 3.125 MG PO TABS: 3.1250 mg | ORAL_TABLET | Freq: Once | ORAL | Status: DC

## 2017-06-01 MED ORDER — VERAPAMIL HCL 2.5 MG/ML IV SOLN
INTRAVENOUS | Status: AC
  Filled 2017-06-01: qty 2

## 2017-06-01 MED ORDER — HEPARIN (PORCINE) IN NACL 2-0.9 UNIT/ML-% IJ SOLN
INTRAMUSCULAR | Status: AC | PRN
  Administered 2017-06-01: 1000 mL via INTRA_ARTERIAL

## 2017-06-01 MED ORDER — LIDOCAINE HCL 1 % IJ SOLN
INTRAMUSCULAR | Status: AC
  Filled 2017-06-01: qty 20

## 2017-06-01 MED ORDER — SODIUM CHLORIDE 0.9 % IV SOLN: 250.0000 mL | INTRAVENOUS | Status: DC | PRN

## 2017-06-01 MED ORDER — MIDAZOLAM HCL 2 MG/2ML IJ SOLN
INTRAMUSCULAR | Status: DC | PRN
  Administered 2017-06-01: 2 mg via INTRAVENOUS

## 2017-06-01 MED ORDER — FENTANYL CITRATE (PF) 100 MCG/2ML IJ SOLN
INTRAMUSCULAR | Status: DC | PRN
  Administered 2017-06-01: 25 ug via INTRAVENOUS

## 2017-06-01 MED ORDER — HEPARIN SODIUM (PORCINE) 1000 UNIT/ML IJ SOLN
INTRAMUSCULAR | Status: AC
Start: 2017-06-01 — End: 2017-06-01
  Filled 2017-06-01: qty 1

## 2017-06-01 MED ORDER — HEPARIN SODIUM (PORCINE) 1000 UNIT/ML IJ SOLN
INTRAMUSCULAR | Status: AC
  Filled 2017-06-01: qty 1

## 2017-06-01 MED ORDER — MIDAZOLAM HCL 2 MG/2ML IJ SOLN
INTRAMUSCULAR | Status: AC
  Filled 2017-06-01: qty 2

## 2017-06-01 MED ORDER — POLYETHYLENE GLYCOL 3350 17 G PO PACK
17.0000 g | PACK | Freq: Two times a day (BID) | ORAL | Status: DC
  Administered 2017-06-01 – 2017-06-02 (×2): 17 g via ORAL
  Filled 2017-06-01 (×2): qty 1

## 2017-06-01 MED ORDER — DEXTROSE 5 % IV SOLN
1.0000 g | INTRAVENOUS | Status: AC
  Administered 2017-06-01: 1 g via INTRAVENOUS
  Filled 2017-06-01: qty 10

## 2017-06-01 MED ORDER — SODIUM CHLORIDE 0.9% FLUSH: 3.0000 mL | INTRAVENOUS | Status: DC | PRN

## 2017-06-01 MED ORDER — LIDOCAINE HCL (PF) 1 % IJ SOLN
INTRAMUSCULAR | Status: AC
  Filled 2017-06-01: qty 30

## 2017-06-01 MED ORDER — CARVEDILOL 6.25 MG PO TABS
6.2500 mg | ORAL_TABLET | Freq: Two times a day (BID) | ORAL | Status: DC
  Administered 2017-06-01 – 2017-06-02 (×2): 6.25 mg via ORAL
  Filled 2017-06-01 (×2): qty 1

## 2017-06-01 MED ORDER — IOPAMIDOL (ISOVUE-370) INJECTION 76%
INTRAVENOUS | Status: DC | PRN
  Administered 2017-06-01: 50 mL via INTRA_ARTERIAL

## 2017-06-01 MED ORDER — SODIUM CHLORIDE 0.9% FLUSH
3.0000 mL | Freq: Two times a day (BID) | INTRAVENOUS | Status: DC
  Administered 2017-06-01: 3 mL via INTRAVENOUS

## 2017-06-01 MED ORDER — IOPAMIDOL (ISOVUE-370) INJECTION 76%
INTRAVENOUS | Status: AC
  Filled 2017-06-01: qty 100

## 2017-06-01 MED ORDER — FENTANYL CITRATE (PF) 100 MCG/2ML IJ SOLN
INTRAMUSCULAR | Status: AC
  Filled 2017-06-01: qty 2

## 2017-06-01 MED ORDER — HEPARIN SODIUM (PORCINE) 1000 UNIT/ML IJ SOLN
INTRAMUSCULAR | Status: DC | PRN
  Administered 2017-06-01: 4000 [IU] via INTRAVENOUS

## 2017-06-01 MED ORDER — HEPARIN (PORCINE) IN NACL 2-0.9 UNIT/ML-% IJ SOLN
INTRAMUSCULAR | Status: AC
  Filled 2017-06-01: qty 1000

## 2017-06-01 MED ORDER — LIDOCAINE HCL (PF) 1 % IJ SOLN
INTRAMUSCULAR | Status: DC | PRN
Start: 2017-06-01 — End: 2017-06-01
  Administered 2017-06-01: 2 mL

## 2017-06-01 MED ORDER — POLYETHYLENE GLYCOL 3350 17 G PO PACK: 17.0000 g | PACK | Freq: Every day | ORAL | Status: DC

## 2017-06-01 SURGICAL SUPPLY — 10 items
CATH EXPO 5F FL3.5 (CATHETERS) ×1 IMPLANT
CATH INFINITI JR4 5F (CATHETERS) ×2 IMPLANT
DEVICE RAD COMP TR BAND LRG (VASCULAR PRODUCTS) ×2 IMPLANT
GLIDESHEATH SLEND SS 6F .021 (SHEATH) ×1 IMPLANT
GUIDEWIRE INQWIRE 1.5J.035X260 (WIRE) IMPLANT
INQWIRE 1.5J .035X260CM (WIRE) ×2
KIT HEART LEFT (KITS) ×2 IMPLANT
PACK CARDIAC CATHETERIZATION (CUSTOM PROCEDURE TRAY) ×2 IMPLANT
TRANSDUCER W/STOPCOCK (MISCELLANEOUS) ×2 IMPLANT
TUBING CIL FLEX 10 FLL-RA (TUBING) ×2 IMPLANT

## 2017-06-01 NOTE — H&P (View-Only) (Signed)
Progress Note  Patient Name: Lindsey Singh Date of Encounter: 06/01/2017  Primary Cardiologist: Dr. Jens Som this admission  Subjective   Feeling fine, no complaints. 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 Her mother also has CHF so she knows about sodium restriction. No CP, SOB, orthopnea.  Inpatient Medications    Scheduled Meds: . amLODipine  10 mg Oral Daily  . aspirin  81 mg Oral Pre-Cath  . aspirin EC  81 mg Oral Daily  . azithromycin  500 mg Oral QHS  . carvedilol  3.125 mg Oral BID WC  . enoxaparin (LOVENOX) injection  40 mg Subcutaneous Q24H  . gabapentin  200 mg Oral QHS  . hydrALAZINE  50 mg Oral Q8H  . insulin aspart  0-9 Units Subcutaneous TID WC  . sodium chloride flush  3 mL Intravenous Q12H  . venlafaxine XR  150 mg Oral Q breakfast   Continuous Infusions: . sodium chloride    . sodium chloride    . cefTRIAXone (ROCEPHIN)  IV     PRN Meds: sodium chloride, acetaminophen **OR** acetaminophen, alum & mag hydroxide-simeth, hydrALAZINE, ondansetron **OR** ondansetron (ZOFRAN) IV, sodium chloride flush, zolpidem   Vital Signs    Vitals:   05/31/17 2055 06/01/17 0506 06/01/17 0827 06/01/17 0829  BP: 114/67 118/73 121/60 121/60  Pulse: 81 81 87 87  Resp: 18 18    Temp: 98.2 F (36.8 C) 98.4 F (36.9 C)    TempSrc: Oral     SpO2: 98% 100%    Weight:  165 lb (74.8 kg)    Height:        Intake/Output Summary (Last 24 hours) at 06/01/17 0929 Last data filed at 06/01/17 0339  Gross per 24 hour  Intake                0 ml  Output              500 ml  Net             -500 ml   Filed Weights   05/30/17 0500 05/31/17 0619 06/01/17 0506  Weight: 160 lb (72.6 kg) 156 lb (70.8 kg) 165 lb (74.8 kg)    Telemetry    NSR - Personally Reviewed   Physical Exam   GEN: No acute distress.  HEENT: Normocephalic, atraumatic, sclera non-icteric. Neck: No JVD or bruits. Cardiac: RRR. 2/6 SEM across precordium, no rubs or  gallops.  Radials/DP/PT 1+ and equal bilaterally.  Respiratory: Clear to auscultation bilaterally. Breathing is unlabored. GI: Soft, nontender, non-distended, BS +x 4. MS: no deformity. Extremities: No clubbing or cyanosis. No edema. Distal pedal pulses are 2+ and equal bilaterally. Neuro:  AAOx3. Follows commands. Psych:  Responds to questions appropriately with a normal affect.  Labs    Chemistry Recent Labs Lab 05/27/17 1935  05/28/17 1053  05/30/17 0319 05/31/17 0451 06/01/17 0358  NA 137  < > 139  < > 137 136 136  K 3.7  < > 3.0*  < > 3.7 4.1 4.1  CL 105  < > 101  < > 98* 99* 99*  CO2 16*  < > 26  < > 27 26 26   GLUCOSE 337*  < > 108*  < > 105* 103* 113*  BUN 10  < > 12  < > 15 23* 27*  CREATININE 1.63*  < > 1.34*  < > 1.35* 1.49* 1.50*  CALCIUM 8.6*  < > 9.2  < >  9.4 10.0 9.8  PROT 6.1*  --  7.0  --  6.8  --   --   ALBUMIN 3.3*  --  4.0  --  3.7  --   --   AST 55*  --  53*  --  33  --   --   ALT 51  --  56*  --  42  --   --   ALKPHOS 83  --  82  --  76  --   --   BILITOT 1.3*  --  1.1  --  0.7  --   --   GFRNONAA 36*  < > 46*  < > 46* 40* 40*  GFRAA 42*  < > 53*  < > 53* 47* 47*  ANIONGAP 16*  < > 12  < > 12 11 11   < > = values in this interval not displayed.   Hematology Recent Labs Lab 05/28/17 2332 05/30/17 0319 06/01/17 0358  WBC 12.6* 10.7* 9.0  RBC 4.51 4.54 4.49  HGB 13.1 13.0 13.2  HCT 39.8 40.4 40.0  MCV 88.2 89.0 89.1  MCH 29.0 28.6 29.4  MCHC 32.9 32.2 33.0  RDW 14.1 14.3 14.3  PLT 387 399 406*    Cardiac Enzymes Recent Labs Lab 05/28/17 0250 05/28/17 0755 05/28/17 1502  TROPONINI 0.24* 0.22* 0.17*    Recent Labs Lab 05/27/17 1941 05/27/17 2246  TROPIPOC 0.06 0.14*     BNP Recent Labs Lab 05/27/17 1935  BNP 1,729.7*     DDimer No results for input(s): DDIMER in the last 168 hours.   Radiology    No results found.  Cardiac Studies   2D Echo 05/29/17 Study Conclusions  - Left ventricle: LVEF is approximately 30 to  35% with severe   hypokinesis/akinesis of the inferior, anteroseptal, nferoseptal   walls; hypokinesis of the anterior wall. Doppler parameters are   consistent with abnormal left ventricular relaxation (grade 1   diastolic dysfunction). - Mitral valve: There was mild regurgitation. - Right ventricle: The cavity size was mildly dilated. Systolic   function was mildly reduced.   Patient Profile     46 y.o. female with h/o HTN and anxiety has had cough x 2 weeks, followed by acute onset of dyspnea, found to have new onset combined CHF and respiratory distress requring bipap. 2D Echo showing EF 30-35%, +WMA as above, grade 1 DD, mild MR, mildly reduced RV systolic function and mildly reduced RV function.  Assessment & Plan    1. 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 Her weight has since been inconsistent but I/O's are -5.7L and she is feeling significantly improved. Long discussion re: 2g sodium diet, 2L fluid restriction, daily weights and likely anticipated further med changes. Plan cath today as planned. Cr with mild uptrend but overall stable since admission (1.6->1.3->1.5). Would avoid LV gram given that LV function is known. Risks and benefits of cardiac catheterization have been discussed with the patient.  These include bleeding, infection, kidney damage, stroke, heart attack, death.  The patient understands these risks and is willing to proceed. Given her new cardiomyopathy, will d/c amlodipine with an eye towards optimizing HF regimen - increase carvedilol to 6.25mg  BID. Continue hydralazine. She was on ARB as outpatient. Would not restart peri-cath, but would aim to consider resuming as OP with close f/u of Cr and potassium. Hold on further  diuretic given pending cath.  2. Newly recognized LBBB - unclear duration. Will ultimately need reassessment of LVEF in  approximately 3 months. If EF remains low will need to consider CRT-D.  3. Accelerated HTN - follow BP with med changes, overall improved from admission.  4. Renal insufficiency of unknown chronicity - UA with only marginal protein; question from prior HTN. Will need close f/u.  Signed, Laurann Montana, PA-C  06/01/2017, 9:29 AM    Personally seen and examined. Agree with above.  46 year old with new LBBB and EF 30%  - no CP currently, SOB improved  - RRR, CTAB, 2+ radial pulse  - Plan for cath - left only OK. Need to determine if CAD is present.   - Agree with holding ARB as creat is mildly elevated.   - Will follow.  Donato Schultz, MD

## 2017-06-01 NOTE — Progress Notes (Signed)
[facility] PROGRESS NOTE  Lindsey Singh ZOX:[id number] DOB: [birth year] DOA: 05/27/2017   PCP: Patient, No Pcp Per     Assessment/Plan: Principal Problem:   Acute respiratory failure with hypoxia (HCC) Active Problems:   Acute pulmonary edema (HCC)   Hypertensive urgency   Acute renal failure (ARF) (HCC)   Fever   Hyperglycemia   Acute respiratory failure (HCC)    Lindsey Singh is a 46 y.o. female with history of hypertension who is originally from [place] visiting her sister started developing shortness of breath today while trying to visit the [place] parade. Patient was in the car and was brought straight to the ER.  In the ER patient was found to be markedly hypertensive with blood pressure more than 200 systolic. Chest x-ray was showing congestion concerning for CHF. EKG was showing sinus tachycardia with LBBB. BNP was 1700. Patient also had fever with leukocytosis. Patient was started on Lasix 60 mg IV and placed on BiPAP and since blood pressure was not improving started on nitroglycerin infusion. Antibiotics for possible pneumonia.  Assessment and plan  1. Acute respiratory failure with hypoxia secondary to pulmonary edema and pneumonia -resolved , status post Lasix  IV . Now held secondary to cardiac cath. Initially placed on nitroglycerin infusion which was tapered off.   trending troponins 0.14>0.24>0.22>0.17 , 2-D echo  shows EF of 30-35% with severe hypokinesis/akinesis. Cardiology planning on doing cardiac cath 7/9.   Scheduled for cath Monday  . Initial VBG showed a pH 7.29, PCO2 42, PO2 74.   . Not on ACE inhibitor or ARB secondary to renal failure. 2. Probable pneumonia -  empiric antibiotics started 7/4.   completing treatment today 7/9.   blood cultures , sputum cultures-no growth so far.   urine for Legionella and  urine strep antigen was negative.  procalcitonin elevated but now trending down.   3. Hypertensive urgency - patient initially on   nitroglycerin infusion, dose of Norvasc increased to 10 mg /day. Also started on hydralazine and Coreg. Patient   was on ACE inhibitor and hydrochlorothiazide which  has been held,   4. Hyperglycemia - new onset diabetes.   hemoglobin A1c 5.6.  Currently on sliding scale coverage. 5. Anemia normocytic normochromic - hemoglobin stable 6. Acute renal failure - no old labs to compare. Platelets are normal. Closely observe since patient is receiving Lasix.  Stable since admission between 1.3 -1.5. Follow closely after cardiac cath 7. Hypokalemia-repleted 8. Fall-CT head normal-PT OT to evaluate.   Dose   decreased to 200 mg daily at bedtime, due to noted altered mental status associated with 600 mg dose.    DVT prophylaxsis Lovenox  Code Status:  Full code   Family Communication: Discussed in detail with the patient, all imaging results, lab results explained to the patient   Disposition Plan:    1-2 days      Consultants:   cardiology   Procedures:  None  Antibiotics: Anti-infectives    Start     Dose/Rate Route Frequency Ordered Stop   05/29/17 2200  azithromycin (ZITHROMAX) tablet 500 mg     500 mg Oral Daily at bedtime 05/29/17 1247 06/04/17 2159   05/28/17 2000  azithromycin (ZITHROMAX) 500 mg in dextrose 5 % 250 mL IVPB  Status:  Discontinued     500 mg 250 mL/hr over 60 Minutes Intravenous Every 24 hours 05/27/17 2237 05/29/17 1247   05/28/17 1800  cefTRIAXone (ROCEPHIN) 1  g in dextrose 5 % 50 mL IVPB     1 g 100 mL/hr over 30 Minutes Intravenous Every 24 hours 05/27/17 2237 06/04/17 1759   05/27/17 2015  cefTRIAXone (ROCEPHIN) 1 g in dextrose 5 % 50 mL IVPB     1 g 100 mL/hr over 30 Minutes Intravenous  Once 05/27/17 2006 05/27/17 2051   05/27/17 2015  azithromycin (ZITHROMAX) 500 mg in dextrose 5 % 250 mL IVPB     500 mg 250 mL/hr over 60 Minutes Intravenous  Once 05/27/17 2006 05/27/17 2204         HPI/Subjective: Afebrile,no cp, no sob  Objective: Vitals:    05/31/17 2055 06/01/17 0506 06/01/17 0827 06/01/17 0829  BP: 114/67 118/73 121/60 121/60  Pulse: 81 81 87 87  Resp: 18 18    Temp: 98.2 F (36.8 C) 98.4 F (36.9 C)    TempSrc: Oral     SpO2: 98% 100%    Weight:  74.8 kg (165 lb)    Height:        Intake/Output Summary (Last 24 hours) at 06/01/17 0908 Last data filed at 06/01/17 1610  Gross per 24 hour  Intake                0 ml  Output              500 ml  Net             -500 ml    Exam:  Examination:  General exam: Comfortable, in no acute distress Respiratory system: Clear to auscultation. Respiratory effort normal. Cardiovascular system: S1 & S2 heard, RRR. No JVD, murmurs, rubs, gallops or clicks. No pedal edema. Gastrointestinal system: Abdomen is nondistended, soft and nontender. No organomegaly or masses felt. Normal bowel sounds heard. Central nervous system: Alert and oriented. No focal neurological deficits. Extremities: Symmetric 5 x 5 power. Skin: No rashes, lesions or ulcers Psychiatry: Judgement and insight appear normal. Mood & affect appropriate.     Data Reviewed: I have personally reviewed following labs and imaging studies  Micro Results Recent Results (from the past 240 hour(s))  Blood culture (routine x 2)     Status: None (Preliminary result)   Collection Time: 05/27/17  7:52 PM  Result Value Ref Range Status   Specimen Description BLOOD RIGHT ARM  Final   Special Requests   Final    BOTTLES DRAWN AEROBIC AND ANAEROBIC Blood Culture adequate volume   Culture NO GROWTH 4 DAYS  Final   Report Status PENDING  Incomplete  Blood culture (routine x 2)     Status: None (Preliminary result)   Collection Time: 05/27/17  7:59 PM  Result Value Ref Range Status   Specimen Description BLOOD LEFT HAND  Final   Special Requests   Final    BOTTLES DRAWN AEROBIC AND ANAEROBIC Blood Culture adequate volume   Culture NO GROWTH 4 DAYS  Final   Report Status PENDING  Incomplete  MRSA PCR Screening      Status: None   Collection Time: 05/28/17  2:46 AM  Result Value Ref Range Status   MRSA by PCR NEGATIVE NEGATIVE Final    Comment:        The GeneXpert MRSA Assay (FDA approved for NASAL specimens only), is one component of a comprehensive MRSA colonization surveillance program. It is not intended to diagnose MRSA infection nor to guide or monitor treatment for MRSA infections.     Radiology Reports Ct Head Wo  Contrast  Result Date: 05/29/2017 CLINICAL DATA:  Fall the EXAM: CT HEAD WITHOUT CONTRAST TECHNIQUE: Contiguous axial images were obtained from the base of the skull through the vertex without intravenous contrast. COMPARISON:  None. FINDINGS: Brain: No mass lesion, intraparenchymal hemorrhage or extra-axial collection. No evidence of acute cortical infarct. Brain parenchyma and CSF-containing spaces are normal for age. Vascular: No hyperdense vessel or unexpected calcification. Skull: Normal visualized skull base, calvarium and extracranial soft tissues. Sinuses/Orbits: No sinus fluid levels or advanced mucosal thickening. No mastoid effusion. Normal orbits. IMPRESSION: Normal head CT. Electronically Signed   By: Deatra Robinson M.D.   On: 05/29/2017 01:19   Dg Chest Portable 1 View  Result Date: 05/27/2017 CLINICAL DATA:  Sudden onset shortness of breath. EXAM: PORTABLE CHEST 1 VIEW COMPARISON:  None. FINDINGS: 1934 hours. Cardiopericardial silhouette is at upper limits of normal for size. Vascular congestion with probable interstitial pulmonary edema. Small bilateral pleural effusions suspected. The visualized bony structures of the thorax are intact. Telemetry leads overlie the chest. IMPRESSION: Low volume film with borderline to mild cardiomegaly, interstitial pulmonary edema pattern and small bilateral pleural effusions. Electronically Signed   By: Kennith Center M.D.   On: 05/27/2017 19:49     CBC  Recent Labs Lab 05/27/17 1935 05/28/17 0250 05/28/17 0755 05/28/17 2332  05/30/17 0319 06/01/17 0358  WBC 16.9* 19.9* 17.2* 12.6* 10.7* 9.0  HGB 11.5* 12.3 12.5 13.1 13.0 13.2  HCT 36.1 37.4 38.3 39.8 40.4 40.0  PLT 460* 415* 430* 387 399 406*  MCV 91.4 88.4 88.7 88.2 89.0 89.1  MCH 29.1 29.1 28.9 29.0 28.6 29.4  MCHC 31.9 32.9 32.6 32.9 32.2 33.0  RDW 14.1 14.0 14.0 14.1 14.3 14.3  LYMPHSABS 6.8*  --   --   --   --   --   MONOABS 0.7  --   --   --   --   --   EOSABS 0.3  --   --   --   --   --   BASOSABS 0.0  --   --   --   --   --     Chemistries   Recent Labs Lab 05/27/17 1935 05/28/17 0250  05/28/17 1053 05/28/17 2332 05/30/17 0319 05/31/17 0451 06/01/17 0358  NA 137  --   < > 139 135 137 136 136  K 3.7  --   < > 3.0* 2.8* 3.7 4.1 4.1  CL 105  --   < > 101 97* 98* 99* 99*  CO2 16*  --   < > 26 27 27 26 26   GLUCOSE 337*  --   < > 108* 133* 105* 103* 113*  BUN 10  --   < > 12 11 15  23* 27*  CREATININE 1.63* 1.30*  < > 1.34* 1.32* 1.35* 1.49* 1.50*  CALCIUM 8.6*  --   < > 9.2 9.4 9.4 10.0 9.8  MG  --  1.8  --   --   --   --   --   --   AST 55*  --   --  53*  --  33  --   --   ALT 51  --   --  56*  --  42  --   --   ALKPHOS 83  --   --  82  --  76  --   --   BILITOT 1.3*  --   --  1.1  --  0.7  --   --   < > =  values in this interval not displayed. ------------------------------------------------------------------------------------------------------------------ estimated creatinine clearance is 37.4 mL/min (A) (by C-G formula based on SCr of 1.5 mg/dL (H)). ------------------------------------------------------------------------------------------------------------------ No results for input(s): HGBA1C in the last 72 hours. ------------------------------------------------------------------------------------------------------------------  Recent Labs  05/30/17 0319  CHOL 161  HDL 42  LDLCALC 92  TRIG 133  CHOLHDL 3.8    ------------------------------------------------------------------------------------------------------------------ No results for input(s): TSH, T4TOTAL, T3FREE, THYROIDAB in the last 72 hours.  Invalid input(s): FREET3 ------------------------------------------------------------------------------------------------------------------ No results for input(s): VITAMINB12, FOLATE, FERRITIN, TIBC, IRON, RETICCTPCT in the last 72 hours.  Coagulation profile  Recent Labs Lab 06/01/17 0358  INR 1.01    No results for input(s): DDIMER in the last 72 hours.  Cardiac Enzymes  Recent Labs Lab 05/28/17 0250 05/28/17 0755 05/28/17 1502  TROPONINI 0.24* 0.22* 0.17*   ------------------------------------------------------------------------------------------------------------------ Invalid input(s): POCBNP   CBG:  Recent Labs Lab 05/30/17 2054 05/31/17 0838 05/31/17 1242 05/31/17 1640 06/01/17 0754  GLUCAP 109* 106* 89 102* 100*       Studies: No results found.    Lab Results  Component Value Date   HGBA1C 5.6 05/28/2017   Lab Results  Component Value Date   LDLCALC 92 05/30/2017   CREATININE 1.50 (H) 06/01/2017       Scheduled Meds: . amLODipine  10 mg Oral Daily  . aspirin  81 mg Oral Pre-Cath  . aspirin EC  81 mg Oral Daily  . azithromycin  500 mg Oral QHS  . carvedilol  3.125 mg Oral BID WC  . enoxaparin (LOVENOX) injection  40 mg Subcutaneous Q24H  . gabapentin  200 mg Oral QHS  . hydrALAZINE  50 mg Oral Q8H  . insulin aspart  0-9 Units Subcutaneous TID WC  . sodium chloride flush  3 mL Intravenous Q12H  . venlafaxine XR  150 mg Oral Q breakfast   Continuous Infusions: . sodium chloride    . sodium chloride    . cefTRIAXone (ROCEPHIN)  IV Stopped (05/31/17 2001)     LOS: 5 days    Time spent: >30 MINS    Richarda Overlie  [facility] Pager [phone]. If 7PM-7AM, please contact night-coverage at [email or web address], password [facility] 06/01/2017,  9:08 AM  LOS: 5 days

## 2017-06-01 NOTE — Progress Notes (Signed)
TR band removed, right radial site level 0, slight bruising noted.

## 2017-06-01 NOTE — Progress Notes (Signed)
Progress Note  Patient Name: Lindsey Singh Date of Encounter: 06/01/2017  Primary Cardiologist: Dr. Jens Som this admission  Subjective   Feeling fine, no complaints. 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 Her mother also has CHF so she knows about sodium restriction. No CP, SOB, orthopnea.  Inpatient Medications    Scheduled Meds: . amLODipine  10 mg Oral Daily  . aspirin  81 mg Oral Pre-Cath  . aspirin EC  81 mg Oral Daily  . azithromycin  500 mg Oral QHS  . carvedilol  3.125 mg Oral BID WC  . enoxaparin (LOVENOX) injection  40 mg Subcutaneous Q24H  . gabapentin  200 mg Oral QHS  . hydrALAZINE  50 mg Oral Q8H  . insulin aspart  0-9 Units Subcutaneous TID WC  . sodium chloride flush  3 mL Intravenous Q12H  . venlafaxine XR  150 mg Oral Q breakfast   Continuous Infusions: . sodium chloride    . sodium chloride    . cefTRIAXone (ROCEPHIN)  IV     PRN Meds: sodium chloride, acetaminophen **OR** acetaminophen, alum & mag hydroxide-simeth, hydrALAZINE, ondansetron **OR** ondansetron (ZOFRAN) IV, sodium chloride flush, zolpidem   Vital Signs    Vitals:   05/31/17 2055 06/01/17 0506 06/01/17 0827 06/01/17 0829  BP: 114/67 118/73 121/60 121/60  Pulse: 81 81 87 87  Resp: 18 18    Temp: 98.2 F (36.8 C) 98.4 F (36.9 C)    TempSrc: Oral     SpO2: 98% 100%    Weight:  165 lb (74.8 kg)    Height:        Intake/Output Summary (Last 24 hours) at 06/01/17 0929 Last data filed at 06/01/17 0339  Gross per 24 hour  Intake                0 ml  Output              500 ml  Net             -500 ml   Filed Weights   05/30/17 0500 05/31/17 0619 06/01/17 0506  Weight: 160 lb (72.6 kg) 156 lb (70.8 kg) 165 lb (74.8 kg)    Telemetry    NSR - Personally Reviewed   Physical Exam   GEN: No acute distress.  HEENT: Normocephalic, atraumatic, sclera non-icteric. Neck: No JVD or bruits. Cardiac: RRR. 2/6 SEM across precordium, no rubs or  gallops.  Radials/DP/PT 1+ and equal bilaterally.  Respiratory: Clear to auscultation bilaterally. Breathing is unlabored. GI: Soft, nontender, non-distended, BS +x 4. MS: no deformity. Extremities: No clubbing or cyanosis. No edema. Distal pedal pulses are 2+ and equal bilaterally. Neuro:  AAOx3. Follows commands. Psych:  Responds to questions appropriately with a normal affect.  Labs    Chemistry Recent Labs Lab 05/27/17 1935  05/28/17 1053  05/30/17 0319 05/31/17 0451 06/01/17 0358  NA 137  < > 139  < > 137 136 136  K 3.7  < > 3.0*  < > 3.7 4.1 4.1  CL 105  < > 101  < > 98* 99* 99*  CO2 16*  < > 26  < > 27 26 26   GLUCOSE 337*  < > 108*  < > 105* 103* 113*  BUN 10  < > 12  < > 15 23* 27*  CREATININE 1.63*  < > 1.34*  < > 1.35* 1.49* 1.50*  CALCIUM 8.6*  < > 9.2  < >  9.4 10.0 9.8  PROT 6.1*  --  7.0  --  6.8  --   --   ALBUMIN 3.3*  --  4.0  --  3.7  --   --   AST 55*  --  53*  --  33  --   --   ALT 51  --  56*  --  42  --   --   ALKPHOS 83  --  82  --  76  --   --   BILITOT 1.3*  --  1.1  --  0.7  --   --   GFRNONAA 36*  < > 46*  < > 46* 40* 40*  GFRAA 42*  < > 53*  < > 53* 47* 47*  ANIONGAP 16*  < > 12  < > 12 11 11   < > = values in this interval not displayed.   Hematology Recent Labs Lab 05/28/17 2332 05/30/17 0319 06/01/17 0358  WBC 12.6* 10.7* 9.0  RBC 4.51 4.54 4.49  HGB 13.1 13.0 13.2  HCT 39.8 40.4 40.0  MCV 88.2 89.0 89.1  MCH 29.0 28.6 29.4  MCHC 32.9 32.2 33.0  RDW 14.1 14.3 14.3  PLT 387 399 406*    Cardiac Enzymes Recent Labs Lab 05/28/17 0250 05/28/17 0755 05/28/17 1502  TROPONINI 0.24* 0.22* 0.17*    Recent Labs Lab 05/27/17 1941 05/27/17 2246  TROPIPOC 0.06 0.14*     BNP Recent Labs Lab 05/27/17 1935  BNP 1,729.7*     DDimer No results for input(s): DDIMER in the last 168 hours.   Radiology    No results found.  Cardiac Studies   2D Echo 05/29/17 Study Conclusions  - Left ventricle: LVEF is approximately 30 to  35% with severe   hypokinesis/akinesis of the inferior, anteroseptal, nferoseptal   walls; hypokinesis of the anterior wall. Doppler parameters are   consistent with abnormal left ventricular relaxation (grade 1   diastolic dysfunction). - Mitral valve: There was mild regurgitation. - Right ventricle: The cavity size was mildly dilated. Systolic   function was mildly reduced.   Patient Profile     46 y.o. female with h/o HTN and anxiety has had cough x 2 weeks, followed by acute onset of dyspnea, found to have new onset combined CHF and respiratory distress requring bipap. 2D Echo showing EF 30-35%, +WMA as above, grade 1 DD, mild MR, mildly reduced RV systolic function and mildly reduced RV function.  Assessment & Plan    1. 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 Her weight has since been inconsistent but I/O's are -5.7L and she is feeling significantly improved. Long discussion re: 2g sodium diet, 2L fluid restriction, daily weights and likely anticipated further med changes. Plan cath today as planned. Cr with mild uptrend but overall stable since admission (1.6->1.3->1.5). Would avoid LV gram given that LV function is known. Risks and benefits of cardiac catheterization have been discussed with the patient.  These include bleeding, infection, kidney damage, stroke, heart attack, death.  The patient understands these risks and is willing to proceed. Given her new cardiomyopathy, will d/c amlodipine with an eye towards optimizing HF regimen - increase carvedilol to 6.25mg  BID. Continue hydralazine. She was on ARB as outpatient. Would not restart peri-cath, but would aim to consider resuming as OP with close f/u of Cr and potassium. Hold on further  diuretic given pending cath.  2. Newly recognized LBBB - unclear duration. Will ultimately need reassessment of LVEF in  approximately 3 months. If EF remains low will need to consider CRT-D.  3. Accelerated HTN - follow BP with med changes, overall improved from admission.  4. Renal insufficiency of unknown chronicity - UA with only marginal protein; question from prior HTN. Will need close f/u.  Signed, Laurann Montana, PA-C  06/01/2017, 9:29 AM    Personally seen and examined. Agree with above.  46 year old with new LBBB and EF 30%  - no CP currently, SOB improved  - RRR, CTAB, 2+ radial pulse  - Plan for cath - left only OK. Need to determine if CAD is present.   - Agree with holding ARB as creat is mildly elevated.   - Will follow.  Donato Schultz, MD

## 2017-06-01 NOTE — Progress Notes (Signed)
PT Cancellation Note  Patient Details Name: Lindsey Singh MRN: [id number] DOB: 05-Aug-1971   Cancelled Treatment:    Reason Eval/Treat Not Completed: Other (comment). Pt scheduled for a heart cath today. PT to return 7/10 as appropriate to reassess s/p heart cath. Pt was amb 300' without AD mod I, however RN did report pt did fall here in hospital. PT to reassess s/p heart cath.   Hanz Winterhalter M Deitra Craine 06/01/2017, 9:49 AM   Lewis Shock, PT, DPT Pager #: [phone] Office #: [phone]

## 2017-06-01 NOTE — Interval H&P Note (Signed)
History and Physical Interval Note:  06/01/2017 10:58 AM  Lindsey Singh  has presented today for surgery, with the diagnosis of cp  The various methods of treatment have been discussed with the patient and family. After consideration of risks, benefits and other options for treatment, the patient has consented to  Procedure(s): Left Heart Cath and Coronary Angiography (N/A) as a surgical intervention .  The patient's history has been reviewed, patient examined, no change in status, stable for surgery.  I have reviewed the patient's chart and labs.  Questions were answered to the patient's satisfaction.     Tonny Bollman

## 2017-06-01 NOTE — Progress Notes (Signed)
Received post Cath, alert and oriented no compalaints of any pain or discomfort. Right radial ,level 0, with TR band 14cc of air per report.

## 2017-06-02 ENCOUNTER — Telehealth: Payer: Self-pay

## 2017-06-02 DIAGNOSIS — I5041 Acute combined systolic (congestive) and diastolic (congestive) heart failure: Secondary | ICD-10-CM

## 2017-06-02 LAB — COMPREHENSIVE METABOLIC PANEL
ALT: 24 U/L (ref 14–54)
ANION GAP: 9 (ref 5–15)
AST: 19 U/L (ref 15–41)
Albumin: 3.6 g/dL (ref 3.5–5.0)
Alkaline Phosphatase: 68 U/L (ref 38–126)
BUN: 21 mg/dL — ABNORMAL HIGH (ref 6–20)
CHLORIDE: 102 mmol/L (ref 101–111)
CO2: 25 mmol/L (ref 22–32)
Calcium: 9.4 mg/dL (ref 8.9–10.3)
Creatinine, Ser: 1.23 mg/dL — ABNORMAL HIGH (ref 0.44–1.00)
GFR, EST AFRICAN AMERICAN: 59 mL/min — AB (ref >60)
GFR, EST NON AFRICAN AMERICAN: 51 mL/min — AB (ref >60)
Glucose, Bld: 96 mg/dL (ref 65–99)
POTASSIUM: 4.3 mmol/L (ref 3.5–5.1)
Sodium: 136 mmol/L (ref 135–145)
Total Bilirubin: 0.7 mg/dL (ref 0.3–1.2)
Total Protein: 6.7 g/dL (ref 6.5–8.1)

## 2017-06-02 LAB — GLUCOSE, CAPILLARY: GLUCOSE-CAPILLARY: 90 mg/dL (ref 65–99)

## 2017-06-02 MED ORDER — FUROSEMIDE 20 MG PO TABS: 20.0000 mg | ORAL_TABLET | Freq: Every day | ORAL | 1 refills | Status: DC | PRN

## 2017-06-02 MED ORDER — SPIRONOLACTONE 25 MG PO TABS: 12.5000 mg | ORAL_TABLET | Freq: Every day | ORAL | 2 refills | Status: DC

## 2017-06-02 MED ORDER — HYDRALAZINE HCL 50 MG PO TABS: 50.0000 mg | ORAL_TABLET | Freq: Three times a day (TID) | ORAL | 2 refills | Status: DC

## 2017-06-02 MED ORDER — FUROSEMIDE 20 MG PO TABS
20.0000 mg | ORAL_TABLET | Freq: Every day | ORAL | 1 refills | Status: DC | PRN
Start: 2017-06-02 — End: 2017-06-24

## 2017-06-02 MED ORDER — POLYETHYLENE GLYCOL 3350 17 G PO PACK: 17.0000 g | PACK | Freq: Two times a day (BID) | ORAL | 0 refills | Status: AC

## 2017-06-02 MED ORDER — POLYETHYLENE GLYCOL 3350 17 G PO PACK: 17.0000 g | PACK | Freq: Two times a day (BID) | ORAL | 0 refills | Status: DC

## 2017-06-02 MED ORDER — ASPIRIN 81 MG PO TBEC: 81.0000 mg | DELAYED_RELEASE_TABLET | Freq: Every day | ORAL | 1 refills | Status: DC

## 2017-06-02 MED ORDER — ASPIRIN 81 MG PO TBEC: 81.0000 mg | DELAYED_RELEASE_TABLET | Freq: Every day | ORAL | 2 refills | Status: AC

## 2017-06-02 MED ORDER — CARVEDILOL 6.25 MG PO TABS: 6.2500 mg | ORAL_TABLET | Freq: Two times a day (BID) | ORAL | 2 refills | Status: DC

## 2017-06-02 MED FILL — POLYETHYLENE GLYCOL 3350 PO: 7 days supply | Qty: 255 | Fill #0

## 2017-06-02 MED FILL — ?SPIRONOLACTONE 25 MG TABLE: 25 | 30 days supply | Qty: 15 | Fill #0

## 2017-06-02 MED FILL — ?FUROSEMIDE 20 MG TABLET: 20 | 30 days supply | Qty: 30 | Fill #0

## 2017-06-02 MED FILL — hydrALAZINE HCL 50 MG TABS: 50 | 30 days supply | Qty: 90 | Fill #0

## 2017-06-02 MED FILL — ?CARVEDILOL 6.25 MG TABLET: 6.25 | 30 days supply | Qty: 60 | Fill #0

## 2017-06-02 NOTE — Care Management Note (Signed)
Case Management Note  Patient Details  Name: Lindsey Singh MRN: [id number] Date of Birth: [birth year]  Subjective/Objective:  Acute Resp Failure with Hypoxia                 Action/Plan: Patient is from [facility] with her spouse, works as a Optician, dispensing, here in [place], [place] to help take care of her mother along with her sisters; patient states that she pays for all of her medication and is agreeable to go to the [facility] for follow up care; she can also get her prescriptions filled there at discharge.Financial Counselor has seen the patient.  Expected Discharge Date:  06/02/17               Expected Discharge Plan:  Home/Self Care  Discharge planning Services  CM Consult, [facility]  Status of Service:  In process, will continue to follow  Reola Mosher [phone] 06/02/2017, 10:50 AM

## 2017-06-02 NOTE — Progress Notes (Addendum)
Discharges rx's were sent in to pharmacy in [place], have redirected to [facility] per patient request, also updated primary team. Per d/w Dr. Anne Fu, have added spironolactone 12.5mg  daily and Lasix 20mg  PRN weight gain/edema. Pt confirms she will be in [place] for a while and able to f/u as planned.  Matthewjames Petrasek PA-C

## 2017-06-02 NOTE — Telephone Encounter (Signed)
Per review of chart, Pt remains inpatient at this time, Pt to DC today 06/02/2017.  Will cont to follow for TCM needs.

## 2017-06-02 NOTE — Progress Notes (Signed)
Progress Note  Patient Name: Lindsey Singh Date of Encounter: 06/02/2017  Primary Cardiologist: Dr. Jens Som this admission  Subjective   Doing well today. Post cath yesterday, no CAD. Breathing well. Ready to go  Inpatient Medications    Scheduled Meds: . aspirin EC  81 mg Oral Daily  . carvedilol  3.125 mg Oral Once  . carvedilol  6.25 mg Oral BID WC  . enoxaparin (LOVENOX) injection  40 mg Subcutaneous Q24H  . gabapentin  200 mg Oral QHS  . hydrALAZINE  50 mg Oral Q8H  . insulin aspart  0-9 Units Subcutaneous TID WC  . polyethylene glycol  17 g Oral BID  . sodium chloride flush  3 mL Intravenous Q12H  . venlafaxine XR  150 mg Oral Q breakfast   Continuous Infusions: . sodium chloride     PRN Meds: sodium chloride, acetaminophen **OR** acetaminophen, alum & mag hydroxide-simeth, hydrALAZINE, ondansetron **OR** ondansetron (ZOFRAN) IV, sodium chloride flush, zolpidem   Vital Signs    Vitals:   06/01/17 2213 06/01/17 2223 06/02/17 0220 06/02/17 0548  BP: 129/61 129/61 114/60 118/64  Pulse:  76 82 87  Resp:  17 17 17   Temp:  98.6 F (37 C) 98.3 F (36.8 C) 98.6 F (37 C)  TempSrc:  Oral Oral Oral  SpO2:  98% 99% 95%  Weight:    160 lb 12.8 oz (72.9 kg)  Height:        Intake/Output Summary (Last 24 hours) at 06/02/17 1012 Last data filed at 06/02/17 0600  Gross per 24 hour  Intake              460 ml  Output              600 ml  Net             -140 ml   Filed Weights   06/01/17 0506 06/01/17 1325 06/02/17 0548  Weight: 165 lb (74.8 kg) 159 lb 7 oz (72.3 kg) 160 lb 12.8 oz (72.9 kg)    Telemetry    NSR - Personally Reviewed   Physical Exam   GEN: Well nourished, well developed, in no acute distress  HEENT: normal  Neck: no JVD, carotid bruits, or masses Cardiac: RRR; 1/6 SM, no rubs, or gallops,no edema  Respiratory:  clear to auscultation bilaterally, normal work of breathing GI: soft, nontender, nondistended, + BS MS: no deformity or  atrophy  Skin: warm and dry, no rash Neuro:  Alert and Oriented x 3, Strength and sensation are intact Psych: euthymic mood, full affect   Labs    Chemistry Recent Labs Lab 05/28/17 1053  05/30/17 0319 05/31/17 0451 06/01/17 0358 06/02/17 0614  NA 139  < > 137 136 136 136  K 3.0*  < > 3.7 4.1 4.1 4.3  CL 101  < > 98* 99* 99* 102  CO2 26  < > 27 26 26 25   GLUCOSE 108*  < > 105* 103* 113* 96  BUN 12  < > 15 23* 27* 21*  CREATININE 1.34*  < > 1.35* 1.49* 1.50* 1.23*  CALCIUM 9.2  < > 9.4 10.0 9.8 9.4  PROT 7.0  --  6.8  --   --  6.7  ALBUMIN 4.0  --  3.7  --   --  3.6  AST 53*  --  33  --   --  19  ALT 56*  --  42  --   --  24  ALKPHOS 82  --  76  --   --  68  BILITOT 1.1  --  0.7  --   --  0.7  GFRNONAA 46*  < > 46* 40* 40* 51*  GFRAA 53*  < > 53* 47* 47* 59*  ANIONGAP 12  < > 12 11 11 9   < > = values in this interval not displayed.   Hematology  Recent Labs Lab 05/28/17 2332 05/30/17 0319 06/01/17 0358  WBC 12.6* 10.7* 9.0  RBC 4.51 4.54 4.49  HGB 13.1 13.0 13.2  HCT 39.8 40.4 40.0  MCV 88.2 89.0 89.1  MCH 29.0 28.6 29.4  MCHC 32.9 32.2 33.0  RDW 14.1 14.3 14.3  PLT 387 399 406*    Cardiac Enzymes  Recent Labs Lab 05/28/17 0250 05/28/17 0755 05/28/17 1502  TROPONINI 0.24* 0.22* 0.17*     Recent Labs Lab 05/27/17 1941 05/27/17 2246  TROPIPOC 0.06 0.14*     BNP  Recent Labs Lab 05/27/17 1935  BNP 1,729.7*     DDimer No results for input(s): DDIMER in the last 168 hours.   Radiology    No results found.  Cardiac Studies   2D Echo 05/29/17 Study Conclusions  - Left ventricle: LVEF is approximately 30 to 35% with severe   hypokinesis/akinesis of the inferior, anteroseptal, nferoseptal   walls; hypokinesis of the anterior wall. Doppler parameters are   consistent with abnormal left ventricular relaxation (grade 1   diastolic dysfunction). - Mitral valve: There was mild regurgitation. - Right ventricle: The cavity size was mildly  dilated. Systolic   function was mildly reduced.   Patient Profile     46 y.o. female with h/o HTN and anxiety has had cough x 2 weeks, followed by acute onset of dyspnea, found to have new onset combined CHF and respiratory distress requring bipap. 2D Echo showing EF 30-35%, +WMA as above, grade 1 DD, mild MR, mildly reduced RV systolic function and mildly reduced RV function.  Assessment & Plan    1. 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  Admit weight listed as 162 but she states she was actually in the 180s before this event.  Out 6L and she is feeling significantly improved. Long discussion again today re: 2g sodium diet, 2L fluid restriction, daily weights and likely anticipated further med changes. If 2-3 pounds up, take lasix 20mg  PRN.  Cr overall stable since admission (1.6->1.3->1.5->1.23).   Given her new cardiomyopathy, will d/c amlodipine with an eye towards optimizing HF regimen - increase carvedilol to 6.25mg  BID. Continue hydralazine. She was on ARB as outpatient. Would restart as outpatient.  Will start spironolactone 12.5mg  PO QD. Check BMET in one week TOC follow up.   2. LBBB - Will ultimately need reassessment of LVEF in approximately 3 months. If EF remains low will need to consider CRT-D. No CAD on cath.  3. Accelerated HTN - follow BP with med changes, overall improved from admission. Stable. ? HTN CM  4. Renal insufficiency of unknown chronicity - UA with only marginal protein; question from prior HTN. Will need close f/u. Stable   OK for DC.   Signed, Donato Schultz, MD  06/02/2017, 10:12 AM

## 2017-06-02 NOTE — Discharge Instructions (Signed)
POST CATH INSTRUCTIONS: No driving for 2 days. No lifting over 5 lbs for 1 week. No sexual activity for 1 week. Keep procedure site clean & dry. If you notice increased pain, swelling, bleeding or pus, call/return!  You may shower, but no soaking baths/hot tubs/pools for 1 week.

## 2017-06-02 NOTE — Discharge Summary (Addendum)
Physician Discharge Summary  Lindsey Singh MRN: [id number] DOB/AGE: [birth year] 48 y.o.  PCP: Patient, No Pcp Per   Admit date: 05/27/2017 Discharge date: 06/02/2017  Discharge Diagnoses:    Principal Problem:   Acute respiratory failure with hypoxia ([place]) Active Problems:   Acute pulmonary edema (H[facility])   Hypertensive urgency   Acute renal failure (ARF) (H[facility])   Fever   Hyperglycemia   Acute respiratory failure (HCC)   Acute combined systolic and diastolic heart failure (H[facility])    Follow-up recommendations Follow-up with PCP in 3-5 days , including all  additional recommended appointments as below Follow-up CBC, CMP in 3-5 days Follow-up with cardiology, consider starting Diovan if renal function remained stable Repeat 2-D echo in 3 months      Current Discharge Medication List    START taking these medications   Details  aspirin EC 81 MG EC tablet Take 1 tablet (81 mg total) by mouth daily. Qty: 30 tablet, Refills: 1    carvedilol (COREG) 6.25 MG tablet Take 1 tablet (6.25 mg total) by mouth 2 (two) times daily with a meal. Qty: 60 tablet, Refills: 2    furosemide (LASIX) 20 MG tablet Take 1 tablet (20 mg total) by mouth daily as needed (for fluid weight gain of 3-5 pounds or increased swelling). Qty: 30 tablet, Refills: 1    hydrALAZINE (APRESOLINE) 50 MG tablet Take 1 tablet (50 mg total) by mouth every 8 (eight) hours. Qty: 90 tablet, Refills: 2    polyethylene glycol (MIRALAX / GLYCOLAX) packet Take 17 g by mouth 2 (two) times daily. Qty: 14 each, Refills: 0    spironolactone (ALDACTONE) 25 MG tablet Take 0.5 tablets (12.5 mg total) by mouth daily. Qty: 30 tablet, Refills: 2      CONTINUE these medications which have NOT CHANGED   Details  Cyanocobalamin (VITAMIN B-12 PO) Take 1 tablet by mouth daily.    gabapentin (NEURONTIN) 300 MG capsule Take 600 mg by mouth at bedtime.     guaiFENesin-dextromethorphan (ROBITUSSIN DM) 100-10 MG/5ML syrup Take 5 mLs by  mouth every 4 (four) hours as needed for cough.    traMADol (ULTRAM) 50 MG tablet Take 50 mg by mouth every 6 (six) hours as needed for moderate pain.    venlafaxine XR (EFFEXOR-XR) 150 MG 24 hr capsule Take 150 mg by mouth daily with breakfast.      STOP taking these medications     amLODipine (NORVASC) 10 MG tablet      bisoprolol-hydrochlorothiazide (ZIAC) 5-6.25 MG tablet      meloxicam (MOBIC) 7.5 MG tablet      valsartan-hydrochlorothiazide (DIOVAN-HCT) 160-12.5 MG tablet          Discharge Condition: Stable  Discharge Instructions Get Medicines reviewed and adjusted: Please take all your medications with you for your next visit with your Primary MD  Please request your Primary MD to go over all hospital tests and procedure/radiological results at the follow up, please ask your Primary MD to get all Hospital records sent to his/her office.  If you experience worsening of your admission symptoms, develop shortness of breath, life threatening emergency, suicidal or homicidal thoughts you must seek medical attention immediately by calling 911 or calling your MD immediately if symptoms less severe.  You must read complete instructions/literature along with all the possible adverse reactions/side effects for all the Medicines you take and that have been prescribed to you. Take any new Medicines after you have completely understood and accpet all the possible  adverse reactions/side effects.   Do not drive when taking Pain medications.   Do not take more than prescribed Pain, Sleep and Anxiety Medications  Special Instructions: If you have smoked or chewed Tobacco in the last 2 yrs please stop smoking, stop any regular Alcohol and or any Recreational drug use.  Wear Seat belts while driving.  Please note  You were cared for by a hospitalist during your hospital stay. Once you are discharged, your primary care physician will handle any further medical issues. Please note  that NO REFILLS for any discharge medications will be authorized once you are discharged, as it is imperative that you return to your primary care physician (or establish a relationship with a primary care physician if you do not have one) for your aftercare needs so that they can reassess your need for medications and monitor your lab values.     Allergies  Allergen Reactions  . Morphine And Related Swelling      Disposition: Home   Consults:  Cardiology    Significant Diagnostic Studies:  Ct Head Wo Contrast  Result Date: 05/29/2017 CLINICAL DATA:  Fall the EXAM: CT HEAD WITHOUT CONTRAST TECHNIQUE: Contiguous axial images were obtained from the base of the skull through the vertex without intravenous contrast. COMPARISON:  None. FINDINGS: Brain: No mass lesion, intraparenchymal hemorrhage or extra-axial collection. No evidence of acute cortical infarct. Brain parenchyma and CSF-containing spaces are normal for age. Vascular: No hyperdense vessel or unexpected calcification. Skull: Normal visualized skull base, calvarium and extracranial soft tissues. Sinuses/Orbits: No sinus fluid levels or advanced mucosal thickening. No mastoid effusion. Normal orbits. IMPRESSION: Normal head CT. Electronically Signed   By: Ulyses Jarred M.D.   On: 05/29/2017 01:19   Dg Chest Portable 1 View  Result Date: 05/27/2017 CLINICAL DATA:  Sudden onset shortness of breath. EXAM: PORTABLE CHEST 1 VIEW COMPARISON:  None. FINDINGS: 1934 hours. Cardiopericardial silhouette is at upper limits of normal for size. Vascular congestion with probable interstitial pulmonary edema. Small bilateral pleural effusions suspected. The visualized bony structures of the thorax are intact. Telemetry leads overlie the chest. IMPRESSION: Low volume film with borderline to mild cardiomegaly, interstitial pulmonary edema pattern and small bilateral pleural effusions. Electronically Signed   By: Misty Stanley M.D.   On: 05/27/2017 19:49     echocardiogram  Left ventricle: LVEF is approximately 30 to 35% with severe   hypokinesis/akinesis of the inferior, anteroseptal, nferoseptal   walls; hypokinesis of the anterior wall. Doppler parameters are   consistent with abnormal left ventricular relaxation (grade 1   diastolic dysfunction). - Mitral valve: There was mild regurgitation. - Right ventricle: The cavity size was mildly dilated. Systolic   function was mildly reduced.    Cardiac cath    Conclusion   1. Angiographically normal coronary arteries 2. Normal LVEDP  Recommend: continued treatment of nonischemic cardiomyopathy      Filed Weights   06/01/17 0506 06/01/17 1325 06/02/17 0548  Weight: 74.8 kg (165 lb) 72.3 kg (159 lb 7 oz) 72.9 kg (160 lb 12.8 oz)     Microbiology: Recent Results (from the past 240 hour(s))  Blood culture (routine x 2)     Status: None   Collection Time: 05/27/17  7:52 PM  Result Value Ref Range Status   Specimen Description BLOOD RIGHT ARM  Final   Special Requests   Final    BOTTLES DRAWN AEROBIC AND ANAEROBIC Blood Culture adequate volume   Culture NO GROWTH 5 DAYS  Final  Report Status 06/01/2017 FINAL  Final  Blood culture (routine x 2)     Status: None   Collection Time: 05/27/17  7:59 PM  Result Value Ref Range Status   Specimen Description BLOOD LEFT HAND  Final   Special Requests   Final    BOTTLES DRAWN AEROBIC AND ANAEROBIC Blood Culture adequate volume   Culture NO GROWTH 5 DAYS  Final   Report Status 06/01/2017 FINAL  Final  MRSA PCR Screening     Status: None   Collection Time: 05/28/17  2:46 AM  Result Value Ref Range Status   MRSA by PCR NEGATIVE NEGATIVE Final    Comment:        The GeneXpert MRSA Assay (FDA approved for NASAL specimens only), is one component of a comprehensive MRSA colonization surveillance program. It is not intended to diagnose MRSA infection nor to guide or monitor treatment for MRSA infections.        Blood  Culture    Component Value Date/Time   SDES BLOOD LEFT HAND 05/27/2017 1959   SPECREQUEST  05/27/2017 1959    BOTTLES DRAWN AEROBIC AND ANAEROBIC Blood Culture adequate volume   CULT NO GROWTH 5 DAYS 05/27/2017 1959   REPTSTATUS 06/01/2017 FINAL 05/27/2017 1959      Labs: Results for orders placed or performed during the hospital encounter of 05/27/17 (from the past 48 hour(s))  Glucose, capillary     Status: Abnormal   Collection Time: 05/31/17  8:38 AM  Result Value Ref Range   Glucose-Capillary 106 (H) 65 - 99 mg/dL  Glucose, capillary     Status: None   Collection Time: 05/31/17 12:42 PM  Result Value Ref Range   Glucose-Capillary 89 65 - 99 mg/dL  Glucose, capillary     Status: Abnormal   Collection Time: 05/31/17  4:40 PM  Result Value Ref Range   Glucose-Capillary 102 (H) 65 - 99 mg/dL  Basic metabolic panel     Status: Abnormal   Collection Time: 06/01/17  3:58 AM  Result Value Ref Range   Sodium 136 135 - 145 mmol/L   Potassium 4.1 3.5 - 5.1 mmol/L   Chloride 99 (L) 101 - 111 mmol/L   CO2 26 22 - 32 mmol/L   Glucose, Bld 113 (H) 65 - 99 mg/dL   BUN 27 (H) 6 - 20 mg/dL   Creatinine, Ser 1.50 (H) 0.44 - 1.00 mg/dL   Calcium 9.8 8.9 - 10.3 mg/dL   GFR calc non Af Amer 40 (L) >60 mL/min   GFR calc Af Amer 47 (L) >60 mL/min    Comment: (NOTE) The eGFR has been calculated using the CKD EPI equation. This calculation has not been validated in all clinical situations. eGFR's persistently <60 mL/min signify possible Chronic Kidney Disease.    Anion gap 11 5 - 15  CBC     Status: Abnormal   Collection Time: 06/01/17  3:58 AM  Result Value Ref Range   WBC 9.0 4.0 - 10.5 K/uL   RBC 4.49 3.87 - 5.11 MIL/uL   Hemoglobin 13.2 12.0 - 15.0 g/dL   HCT 40.0 36.0 - 46.0 %   MCV 89.1 78.0 - 100.0 fL   MCH 29.4 26.0 - 34.0 pg   MCHC 33.0 30.0 - 36.0 g/dL   RDW 14.3 11.5 - 15.5 %   Platelets 406 (H) 150 - 400 K/uL  Protime-INR     Status: None   Collection Time: 06/01/17   3:58 AM  Result Value  Ref Range   Prothrombin Time 13.3 11.4 - 15.2 seconds   INR 1.01   Glucose, capillary     Status: Abnormal   Collection Time: 06/01/17  7:54 AM  Result Value Ref Range   Glucose-Capillary 100 (H) 65 - 99 mg/dL  Glucose, capillary     Status: None   Collection Time: 06/01/17  4:56 PM  Result Value Ref Range   Glucose-Capillary 94 65 - 99 mg/dL  Glucose, capillary     Status: Abnormal   Collection Time: 06/01/17 10:18 PM  Result Value Ref Range   Glucose-Capillary 116 (H) 65 - 99 mg/dL   Comment 1 Notify RN    Comment 2 Document in Chart   Comprehensive metabolic panel     Status: Abnormal   Collection Time: 06/02/17  6:14 AM  Result Value Ref Range   Sodium 136 135 - 145 mmol/L   Potassium 4.3 3.5 - 5.1 mmol/L   Chloride 102 101 - 111 mmol/L   CO2 25 22 - 32 mmol/L   Glucose, Bld 96 65 - 99 mg/dL   BUN 21 (H) 6 - 20 mg/dL   Creatinine, Ser 1.23 (H) 0.44 - 1.00 mg/dL   Calcium 9.4 8.9 - 10.3 mg/dL   Total Protein 6.7 6.5 - 8.1 g/dL   Albumin 3.6 3.5 - 5.0 g/dL   AST 19 15 - 41 U/L   ALT 24 14 - 54 U/L   Alkaline Phosphatase 68 38 - 126 U/L   Total Bilirubin 0.7 0.3 - 1.2 mg/dL   GFR calc non Af Amer 51 (L) >60 mL/min   GFR calc Af Amer 59 (L) >60 mL/min    Comment: (NOTE) The eGFR has been calculated using the CKD EPI equation. This calculation has not been validated in all clinical situations. eGFR's persistently <60 mL/min signify possible Chronic Kidney Disease.    Anion gap 9 5 - 15  Glucose, capillary     Status: None   Collection Time: 06/02/17  7:34 AM  Result Value Ref Range   Glucose-Capillary 90 65 - 99 mg/dL   Comment 1 Notify RN    Comment 2 Document in Chart      Lipid Panel     Component Value Date/Time   CHOL 161 05/30/2017 0319   TRIG 133 05/30/2017 0319   HDL 42 05/30/2017 0319   CHOLHDL 3.8 05/30/2017 0319   VLDL 27 05/30/2017 0319   LDLCALC 92 05/30/2017 0319     Lab Results  Component Value Date   HGBA1C 5.6  05/28/2017       HPI :  Lindsey Singh a 46 y.o.femalewith history of hypertension who is originally from [place] visiting her sister started developing shortness of breath today while trying to visit the [place] parade. Patient was in the car and was brought straight to the ER.  In the ER patient was found to be markedly hypertensive with blood pressure more than 299 systolic. Chest x-ray was showing congestion concerning for CHF. EKG was showing sinus tachycardia with LBBB. BNP was 1700. Patient also had fever with leukocytosis. Patient was started on Lasix 60 mg IV and placed on BiPAP and since blood pressure was not improving started on nitroglycerin infusion. Antibiotics for possible pneumonia. Hospitalist admitted for further work up. Echo this admission showed 30-35% EF with hypokinesis in the inferior, antero and septal walls  . Trop cycled with mild flat trend 0.24>>0.22>0.17. Cardiology consulted for further evaluation  HOSPITAL COURSE:  1. Acute respiratory failure with hypoxia secondary to acute combined CHF  and pneumonia -resolved after being treated with  Lasix  IV . Lasix held prior to  cardiac cath. Initially placed on nitroglycerin and stepdown infusion which was tapered off.    serial troponins 0.14>0.24>0.22>0.17 , 2-D echo   showed EF of 30-35% with severe hypokinesis/akinesis. Cardiology consulted and performed   cardiac cath 7/9 which showed angiographically normal coronaries with normal  LVEDP.  medical therapy recommended.   patient treated for acute on chronic combined CHF. started on hydralazine, Coreg.    ARB held secondary to increased creatinine. Recommend starting Diovan as outpatient if renal function remained stable. Could also consider adding low-dose Lasix. Patient is to follow-up with Dr. Kirk Ruths. Patient would need repeat 2-D echo in 3 months. 2. Probable pneumonia -  empiric antibiotics started 7/4-7/10.    treated with Rocephin and azithromycin.     blood cultures , sputum cultures-no growth so far.   urine for Legionella and  urine strep antigen was negative.   pneumonia seems to have resolved 3. Hypertensive urgency - patient initially on  nitroglycerin infusion,   Norvasc discontinued in light of new diagnosis of CHF.   initiated on hydralazine and Coreg. Patient   was on ARB  and hydrochlorothiazide which  has been held,   may resume Diovan if renal function remained stable. 4. Hyperglycemia - new onset diabetes.   hemoglobin A1c 5.6.   maintained on sliding scale coverage. 5. Anemia normocytic normochromic - hemoglobin stable 6. Acute renal failure - no old labs to compare.  . Renal function improved post cardiac cath from 1.6>1.23.  Follow closely as outpatient. 7. Hypokalemia-repleted 8. Fall-CT head normal- likely secondary to high doses of gabapentin at bedtime   Discharge Exam  Blood pressure 118/64, pulse 87, temperature 98.6 F (37 C), temperature source Oral, resp. rate 17, height _0  (1.422 m), weight 72.9 kg (160 lb 12.8 oz), SpO2 95 %.  Cardiac:RRR. 2/6 SEM across precordium, no rubs or gallops.  Radials/DP/PT 1+ and equal bilaterally.  Respiratory:Clear to auscultation bilaterally. Breathing is unlabored. ER:DEYC, nontender, non-distended, BS +x 4. MS:no deformity. Extremities: No clubbing or cyanosis. No edema. Distal pedal pulses are 2+ and equal bilaterally. Neuro:AAOx3. Follows commands. Psych:  Responds to questions appropriately with a normal affect.    Follow-up Information    Primary care provider. Call.   Why:  Hospital follow-up in 3-5 days, BMP       Stanford Breed Denice Bors, MD. Call.   Specialty:  Cardiology Why:  Call for follow-up, needs 2-D echo in 3 months Contact information: [address]  [place] [address] [phone]           Signed: Reyne Dumas 06/02/2017, 8:37 AM        Time spent >1 hour

## 2017-06-03 MED FILL — Lidocaine HCl Local Inj 1%: INTRAMUSCULAR | Qty: 20 | Status: AC

## 2017-06-03 MED FILL — Heparin Sodium (Porcine) 2 Unit/ML in Sodium Chloride 0.9%: INTRAMUSCULAR | Qty: 500 | Status: AC

## 2017-06-03 MED FILL — Verapamil HCl IV Soln 2.5 MG/ML: INTRAVENOUS | Qty: 2 | Status: AC

## 2017-06-03 MED FILL — Fentanyl Citrate Preservative Free (PF) Inj 100 MCG/2ML: INTRAMUSCULAR | Qty: 2 | Status: AC

## 2017-06-03 MED FILL — Midazolam HCl Inj 2 MG/2ML (Base Equivalent): INTRAMUSCULAR | Qty: 2 | Status: AC

## 2017-06-04 ENCOUNTER — Telehealth: Payer: Self-pay

## 2017-06-04 NOTE — Telephone Encounter (Signed)
Per Dr. Jens Som, OK to reschedule appointment for Pt.  Appt made for 06/24/2017 @ 1030 with [place] @ [facility] office.  Notified Pt of appt change.  Pt expressed appreciation.  Notified Pt that if she has any sob, increased weight or chest pain to call office and we will get Pt in earlier.  Pt indicates understanding.

## 2017-06-04 NOTE — Telephone Encounter (Signed)
Patient contacted regarding discharge from [facility] on 06/02/2017.  Patient understands to follow up with provider B. Strader on 06/12/2017 at 0900 at [facility] office.  Gave directions to [facility] office. Patient understands discharge instructions? Yes.  Pt has been weighing herself daily and taking BP daily. Patient understands medications and regiment? Yes, Pt has all her new prescriptions and knows what medications have been discontinued. Patient understands to bring all medications to this visit? yes  Pt states she is doing well.  She states she is not 100% back to normal, but she is slowly feeling better.  She denies any chest pain or sob.  Discussed medication regiment, Pt has good understanding of her medications.  Pt asked about how much water she should be drinking daily.  Notified Pt that with the weather as hot as it is, it's hard to give an exact amount.  Notified Pt that she should use her daily weights as a guide.  If she is staying about the same weight then her fluid intake is good.  Told her to be aware of any swelling, or if her breathing becomes harder.  Told to be aware of how many pillows she requires to breathe comfortably at night.  Educated Pt that these are all early signs that she is taking in too much fluid and her heart is having trouble getting rid of it.  Pt indicates understanding.  Notified Pt to contact office right away if she has any early signs of fluid overload because we could make medication changes over the phone that would keep her out of the hospital.  Pt indicates understanding.  Pt seems knowledgeable of her condition and motivated to manage her health.

## 2017-06-09 ENCOUNTER — Encounter: Payer: Self-pay | Admitting: Internal Medicine

## 2017-06-09 ENCOUNTER — Ambulatory Visit: Payer: Self-pay | Attending: Internal Medicine | Admitting: Internal Medicine

## 2017-06-09 VITALS — BP 124/79 | HR 69 | Temp 99.0°F | Resp 16 | Wt 165.0 lb

## 2017-06-09 DIAGNOSIS — I10 Essential (primary) hypertension: Secondary | ICD-10-CM | POA: Insufficient documentation

## 2017-06-09 DIAGNOSIS — G479 Sleep disorder, unspecified: Secondary | ICD-10-CM | POA: Insufficient documentation

## 2017-06-09 DIAGNOSIS — F419 Anxiety disorder, unspecified: Secondary | ICD-10-CM

## 2017-06-09 DIAGNOSIS — N179 Acute kidney failure, unspecified: Secondary | ICD-10-CM | POA: Insufficient documentation

## 2017-06-09 DIAGNOSIS — Z1239 Encounter for other screening for malignant neoplasm of breast: Secondary | ICD-10-CM

## 2017-06-09 DIAGNOSIS — R739 Hyperglycemia, unspecified: Secondary | ICD-10-CM | POA: Insufficient documentation

## 2017-06-09 DIAGNOSIS — I428 Other cardiomyopathies: Secondary | ICD-10-CM

## 2017-06-09 DIAGNOSIS — Z79899 Other long term (current) drug therapy: Secondary | ICD-10-CM | POA: Insufficient documentation

## 2017-06-09 DIAGNOSIS — Z885 Allergy status to narcotic agent status: Secondary | ICD-10-CM | POA: Insufficient documentation

## 2017-06-09 DIAGNOSIS — Z1231 Encounter for screening mammogram for malignant neoplasm of breast: Secondary | ICD-10-CM

## 2017-06-09 DIAGNOSIS — Z7982 Long term (current) use of aspirin: Secondary | ICD-10-CM | POA: Insufficient documentation

## 2017-06-09 DIAGNOSIS — M797 Fibromyalgia: Secondary | ICD-10-CM | POA: Insufficient documentation

## 2017-06-09 DIAGNOSIS — Z833 Family history of diabetes mellitus: Secondary | ICD-10-CM | POA: Insufficient documentation

## 2017-06-09 DIAGNOSIS — Z8249 Family history of ischemic heart disease and other diseases of the circulatory system: Secondary | ICD-10-CM | POA: Insufficient documentation

## 2017-06-09 HISTORY — DX: Fibromyalgia: M79.7

## 2017-06-09 HISTORY — DX: Anxiety disorder, unspecified: F41.9

## 2017-06-09 HISTORY — DX: Essential (primary) hypertension: I10

## 2017-06-09 HISTORY — DX: Other cardiomyopathies: I42.8

## 2017-06-09 MED ORDER — GLUCOSE BLOOD VI STRP: ORAL_STRIP | 12 refills | Status: AC

## 2017-06-09 MED ORDER — TRUEPLUS LANCETS 28G MISC: 1 refills | Status: AC

## 2017-06-09 MED ORDER — TRUE METRIX METER W/DEVICE KIT: PACK | 0 refills | Status: AC

## 2017-06-09 MED ORDER — HYDROXYZINE HCL 25 MG PO TABS: 25.0000 mg | ORAL_TABLET | Freq: Every evening | ORAL | 1 refills | Status: AC | PRN

## 2017-06-09 MED FILL — TRUE METRIX BLOOD GLUCOSE M: W/DEVICE | 365 days supply | Qty: 1 | Fill #0

## 2017-06-09 MED FILL — TRUEplus LANCETS 28G MISC: 25 days supply | Qty: 100 | Fill #0

## 2017-06-09 MED FILL — hydrOXYzine HCL 25 MG TABS: 25 | 30 days supply | Qty: 30 | Fill #0

## 2017-06-09 MED FILL — TRUE METRIX TEST STRIP: 25 days supply | Qty: 100 | Fill #0

## 2017-06-09 NOTE — Patient Instructions (Signed)
Check your blood sugars first thing in the mornings before breakfast and dinner 2-3 times a week and bring in readings on next visit. Here are some tips on healthy eating habits. Follow a Healthy Eating Plan - You can do it! Limit sugary drinks.  Avoid sodas, sweet tea, sport or energy drinks, or fruit drinks.  Drink water, lo-fat milk, or diet drinks. Limit snack foods.   Cut back on candy, cake, cookies, chips, ice cream.  These are a special treat, only in small amounts. Eat plenty of vegetables.  Especially dark green, red, and orange vegetables. Aim for at least 3 servings a day. More is better! Include fruit in your daily diet.  Whole fruit is much healthier than fruit juice! Limit "white" bread, "white" pasta, "white" rice.   Choose "100% whole grain" products, brown or wild rice. Avoid fatty meats. Try "Meatless Monday" and choose eggs or beans one day a week.  When eating meat, choose lean meats like chicken, [place], and fish.  Grill, broil, or bake meats instead of frying, and eat poultry without the skin. Eat less salt.  Avoid frozen pizzas, frozen dinners and salty foods.  Use seasonings other than salt in cooking.  This can help blood pressure and keep you from swelling Beer, wine and liquor have calories.  If you can safely drink alcohol, limit to 1 drink per day for women, 2 drinks for men   Use the Hydroxyzine at bedtime as needed.   Try doing some relaxation exercises before bedtime.

## 2017-06-09 NOTE — Progress Notes (Signed)
Patient ID: Lindsey Singh, female    DOB: 10-02-1971  MRN: [id number]  CC: Hospitalization Follow-up   Subjective: Lindsey Singh is a 46 y.o. female who presents for hosp f/u. Sister,Shirita Stann Mainland, is with her. Pt lives in [facility] and is here to help take care of mom ( who will be undergoing surgeries soon) and sister (who is on HD). Hx of HTN, fibromyalgia and anxiety. Last saw her  PCP 02/2017.  Her concerns today include:  Patient with history of hypertension who was recently hospitalized 7/4-08/2017 at [facility]  with acute hypoxic respiratory failure due to acute pulmonary edema, hypertensive urgency and probable pneumonia. Patient had normal coronaries on cardiac catheterization. Echo revealed an EF of 30-35 percent with hypokinesis in the inferioanterior and septal walls. Did have some acute renal insuff that improved post cath  1. NICM/HTN -has cardio appt 8/1. -compliant with meds. Limits salt -no CP/SOB/LE edema.  Sleeping on 1 pillow -bp running 120s/70s  2. Hx of Anxiety -on Effexor x 3 yrs and feels she is well controlled. Since hosp, waking through the night even with Gabapentin. She sleeps with all sounds on lights off and does not drink caffeinated beverages at nights.  Sleeps on the same floor as her mother to attend to any needs that she may have during the night  3. Hx of Fibromyalgia. Was seeing a rheumatologist. Last seen in 10/2016. Placed on Neurontin and Tramadol.  Not taking Tramadol every day. Last took 3 wks ago. Still has some with her and does not need refill at this time  4. BS was 300 on admission.  Subsequent BS were good and A1C was 5.4 -fhx of DM in mom and father -loss from 190 to 160 lbs since being in [place].  "not eating heavy meals like I use to when I was at home in [facility] -no polyuria or polydipsia. No blurred vision  Patient Active Problem List   Diagnosis Date Noted  . Acute combined systolic and diastolic heart failure ([place]) 06/01/2017  . Acute  respiratory failure with hypoxia ([place]) 05/27/2017  . Acute pulmonary edema ([place]) 05/27/2017  . Hypertensive urgency 05/27/2017  . Acute renal failure (ARF) ([place]) 05/27/2017  . Fever 05/27/2017  . Hyperglycemia 05/27/2017  . Acute respiratory failure ([place]) 05/27/2017     Current Outpatient Prescriptions on File Prior to Visit  Medication Sig Dispense Refill  . aspirin 81 MG EC tablet Take 1 tablet (81 mg total) by mouth daily. 30 tablet 2  . carvedilol (COREG) 6.25 MG tablet Take 1 tablet (6.25 mg total) by mouth 2 (two) times daily with a meal. 60 tablet 2  . Cyanocobalamin (VITAMIN B-12 PO) Take 1 tablet by mouth daily.    . furosemide (LASIX) 20 MG tablet Take 1 tablet (20 mg total) by mouth daily as needed (for fluid weight gain of 3-5 pounds or increased swelling). 30 tablet 1  . gabapentin (NEURONTIN) 300 MG capsule Take 600 mg by mouth at bedtime.     Marland Kitchen guaiFENesin-dextromethorphan (ROBITUSSIN DM) 100-10 MG/5ML syrup Take 5 mLs by mouth every 4 (four) hours as needed for cough.    . hydrALAZINE (APRESOLINE) 50 MG tablet Take 1 tablet (50 mg total) by mouth every 8 (eight) hours. 90 tablet 2  . polyethylene glycol (MIRALAX / GLYCOLAX) packet Take 17 g by mouth 2 (two) times daily. 14 each 0  . spironolactone (ALDACTONE) 25 MG tablet Take 0.5 tablets (12.5 mg total) by mouth daily. 30 tablet 2  .  traMADol (ULTRAM) 50 MG tablet Take 50 mg by mouth every 6 (six) hours as needed for moderate pain.    Marland Kitchen venlafaxine XR (EFFEXOR-XR) 150 MG 24 hr capsule Take 150 mg by mouth daily with breakfast.     No current facility-administered medications on file prior to visit.     Allergies  Allergen Reactions  . Morphine And Related Swelling    Social History   Social History  . Marital status: Single    Spouse name: N/A  . Number of children: N/A  . Years of education: N/A   Occupational History  . Not on file.   Social History Main Topics  . Smoking status: Never Smoker  .  Smokeless tobacco: Never Used  . Alcohol use No  . Drug use: No  . Sexual activity: Not on file   Other Topics Concern  . Not on file   Social History Narrative  . No narrative on file    Family History  Problem Relation Age of Onset  . Diabetes Mellitus II Mother   . Hypertension Mother   . Hypertension Father     Past Surgical History:  Procedure Laterality Date  . LEFT HEART CATH AND CORONARY ANGIOGRAPHY N/A 06/01/2017   Procedure: Left Heart Cath and Coronary Angiography;  Surgeon: Sherren Mocha, MD;  Location: [facility];  Service: Cardiovascular;  Laterality: N/A;    ROS: Review of Systems  Constitutional: Positive for appetite change. Negative for fatigue and fever.  Eyes: Negative for photophobia and visual disturbance.  Respiratory: Negative for shortness of breath and wheezing.   Cardiovascular: Negative for chest pain, palpitations and leg swelling.  Neurological: Negative for dizziness and headaches.    PHYSICAL EXAM: BP 124/79   Pulse 69   Temp 99 F (37.2 C) (Oral)   Resp 16   Wt 165 lb (74.8 kg)   SpO2 96%   BMI 36.99 kg/m   Wt Readings from Last 3 Encounters:  06/09/17 165 lb (74.8 kg)  06/02/17 160 lb 12.8 oz (72.9 kg)    Physical Exam  General appearance - alert, well appearing, middle age AAF and in no distress Mental status - alert, oriented to person, place, and time, normal mood, behavior, speech, dress, motor activity, and thought processes Eyes - pupils equal and reactive, extraocular eye movements intact Mouth - mucous membranes moist, pharynx normal without lesions Neck - supple, no significant adenopathy Chest - clear to auscultation, no wheezes, rales or rhonchi, symmetric air entry Heart - normal rate, regular rhythm, normal S1, S2, no murmurs, rubs, clicks or gallops Extremities - peripheral pulses normal, no pedal edema, no clubbing or cyanosis   Depression screen PHQ 2/9 06/09/2017  Decreased Interest 3  Down,  Depressed, Hopeless 0  PHQ - 2 Score 3  Altered sleeping 2  Tired, decreased energy 3  Change in appetite 0  Feeling bad or failure about yourself  0  Trouble concentrating 0  Moving slowly or fidgety/restless 1  Suicidal thoughts 0  PHQ-9 Score 9   GAD 7 : Generalized Anxiety Score 06/09/2017  Nervous, Anxious, on Edge 3  Control/stop worrying 2  Worry too much - different things 2  Trouble relaxing 3  Restless 3  Easily annoyed or irritable 3  Afraid - awful might happen 3  Total GAD 7 Score 19   Lab Results  Component Value Date   HGBA1C 5.6 05/28/2017   Lab Results  Component Value Date   WBC 9.0 06/01/2017  HGB 13.2 06/01/2017   HCT 40.0 06/01/2017   MCV 89.1 06/01/2017   PLT 406 (H) 06/01/2017     Chemistry      Component Value Date/Time   NA 136 06/02/2017 0614   K 4.3 06/02/2017 0614   CL 102 06/02/2017 0614   CO2 25 06/02/2017 0614   BUN 21 (H) 06/02/2017 0614   CREATININE 1.23 (H) 06/02/2017 0614      Component Value Date/Time   CALCIUM 9.4 06/02/2017 0614   ALKPHOS 68 06/02/2017 0614   AST 19 06/02/2017 0614   ALT 24 06/02/2017 0614   BILITOT 0.7 06/02/2017 0614        ASSESSMENT AND PLAN: 1. NICM (nonischemic cardiomyopathy) ([place]) -Stable on current medications. Check chemistry today and if kidney function has continued to improve cardiology can  add an Ace or an ARB when she is seen FIRST - Comprehensive metabolic panel - CBC  2. Essential hypertension -At goal. See plan above.  3. Anxiety disorder, unspecified type -Continue Effexor. GAD 7 score is elevated today but patient feels that she is adequately controlled on this medication. -Sleep disturbance may be due to the fact that she is in a new environment and concern about caring for her mother's needs during the night. At low-dose hydroxyzine - hydrOXYzine (ATARAX/VISTARIL) 25 MG tablet; Take 1 tablet (25 mg total) by mouth at bedtime as needed.  Dispense: 30 tablet; Refill: 1  4.  Fibromyalgia -Symptoms controlled. Continue Neurontin and tramadol  5. Elevated blood sugar -Elevated blood sugar on admission may have been due to metabolic stress. Patient will get glucometer today to check blood sugars before breakfast and dinner and bring them in on follow-up visit - Blood Glucose Monitoring Suppl (TRUE METRIX METER) w/Device KIT; Use as directed  Dispense: 1 kit; Refill: 0 - glucose blood (TRUE METRIX BLOOD GLUCOSE TEST) test strip; Use as instructed  Dispense: 100 each; Refill: 12 - TRUEPLUS LANCETS 28G MISC; Use as directed  Dispense: 100 each; Refill: 1  6. Breast cancer screening - MM Digital Screening; Future  7.  Acute renal failure -improving.  Patient was given the opportunity to ask questions.  Patient verbalized understanding of the plan and was able to repeat key elements of the plan.   Orders Placed This Encounter  Procedures  . MM Digital Screening  . Comprehensive metabolic panel  . CBC     Requested Prescriptions   Signed Prescriptions Disp Refills  . hydrOXYzine (ATARAX/VISTARIL) 25 MG tablet 30 tablet 1    Sig: Take 1 tablet (25 mg total) by mouth at bedtime as needed.  . Blood Glucose Monitoring Suppl (TRUE METRIX METER) w/Device KIT 1 kit 0    Sig: Use as directed  . glucose blood (TRUE METRIX BLOOD GLUCOSE TEST) test strip 100 each 12    Sig: Use as instructed  . TRUEPLUS LANCETS 28G MISC 100 each 1    Sig: Use as directed    Return in about 6 weeks (around 07/21/2017) for pap.  Karle Plumber, MD, FACP

## 2017-06-10 ENCOUNTER — Telehealth: Payer: Self-pay | Admitting: Internal Medicine

## 2017-06-10 LAB — COMPREHENSIVE METABOLIC PANEL
A/G RATIO: 1.4 (ref 1.2–2.2)
ALT: 14 IU/L (ref 0–32)
AST: 12 IU/L (ref 0–40)
Albumin: 4.1 g/dL (ref 3.5–5.5)
Alkaline Phosphatase: 85 IU/L (ref 39–117)
BUN/Creatinine Ratio: 13 (ref 9–23)
BUN: 16 mg/dL (ref 6–24)
Bilirubin Total: 0.3 mg/dL (ref 0.0–1.2)
CALCIUM: 10.3 mg/dL — AB (ref 8.7–10.2)
CHLORIDE: 100 mmol/L (ref 96–106)
CO2: 23 mmol/L (ref 20–29)
Creatinine, Ser: 1.28 mg/dL — ABNORMAL HIGH (ref 0.57–1.00)
GFR calc Af Amer: 57 mL/min/{1.73_m2} — ABNORMAL LOW (ref >59)
GFR, EST NON AFRICAN AMERICAN: 50 mL/min/{1.73_m2} — AB (ref >59)
GLUCOSE: 95 mg/dL (ref 65–99)
Globulin, Total: 2.9 g/dL (ref 1.5–4.5)
POTASSIUM: 5.2 mmol/L (ref 3.5–5.2)
Sodium: 138 mmol/L (ref 134–144)
Total Protein: 7 g/dL (ref 6.0–8.5)

## 2017-06-10 LAB — CBC
HEMOGLOBIN: 12.3 g/dL (ref 11.1–15.9)
Hematocrit: 36.9 % (ref 34.0–46.6)
MCH: 29.3 pg (ref 26.6–33.0)
MCHC: 33.3 g/dL (ref 31.5–35.7)
MCV: 88 fL (ref 79–97)
Platelets: 431 10*3/uL — ABNORMAL HIGH (ref 150–379)
RBC: 4.2 x10E6/uL (ref 3.77–5.28)
RDW: 14.4 % (ref 12.3–15.4)
WBC: 8 10*3/uL (ref 3.4–10.8)

## 2017-06-10 NOTE — Telephone Encounter (Signed)
Phone call placed to pt this a.m.  Left message on her voicemail letting her know that I was calling to go over lab results.  Her kidney function is not 100% but remains improved from hospitalization. No anemia on blood count.  Mild elevation of PLT count which can be monitored for now.

## 2017-06-12 ENCOUNTER — Ambulatory Visit: Payer: Self-pay | Admitting: Student

## 2017-06-22 NOTE — Progress Notes (Signed)
Cardiology Office Note    Date:  06/24/2017   ID:  Lindsey Singh, DOB Feb 12, 1971, MRN [id number]  PCP:  Ladell Pier, MD  Cardiologist: Dr. Stanford Breed  Chief Complaint  Patient presents with  . Hospitalization Follow-up    History of Present Illness:    Lindsey Singh is a 46 y.o. female with past medical history of anxiety, HTN, and Fibromyalgia who presents to the office today for hospital follow-up.   She was recently admitted to [facility] from 7/4 - 06/02/2017 for worsening dyspnea on exertion and at rest, found to have acute systolic CHF as echo showed an EF of 30-35% with inferior, antero and septal HK. She was diuresed with IV Lasix and once volume status improved, a cardiac catheterization was performed which showed normal coronary arteries, therefore making her presentation consistent with nonischemic cardiomyopathy. Her overall net output was -6L with a discharge weight of 160 lbs.  Creatinine had improved to 1.23. She was started on Coreg 6.60m BID, Lasix 274mPRN, and Spironolactone 12.65m58maily along with being continued on PTA Hydralazine. It was recommended to resume her ARB as an outpatient if kidney function remained stable.   In talking with the patient today, she reports overall doing well since her recent hospitalization. She has eliminated salt from her diet as much as possible and is consuming less than 2 L of fluid per day. She has been weighing daily and weights have been between 164 - 167 lbs on her home scales. She denies any recent orthopnea, PND, or lower extremity edema. She has PRN Lasix to take as needed for swelling or weight gain and reports taking this 3-4 times since her hospital discharge.  She denies any recent chest discomfort or palpitations. Reports good compliance with her medication regimen. Has been checking blood pressure at home with systolic readings in the 120782'N 140's.  In discussing different types of cardiomyopathies, she does  report being under increased stress for the past few months. Her son passed away approximately 6 years ago when he was 7 y58ars old due to hydrocephalus. The anniversary of his death was just last month. She also reports having moved here from [place] help care for her mother, father, and sister who all have chronic medical conditions.   Past Medical History:  Diagnosis Date  . Anxiety   . Hypertension   . Nonischemic cardiomyopathy ([place]  a. 05/2017: echo showing EF of 30-35% with inferior, antero, and septal HK. Cath with normal coronary arteries.    Past Surgical History:  Procedure Laterality Date  . LEFT HEART CATH AND CORONARY ANGIOGRAPHY N/A 06/01/2017   Procedure: Left Heart Cath and Coronary Angiography;  Surgeon: CooSherren MochaD;  Location: [facility];  Service: Cardiovascular;  Laterality: N/A;    Current Medications: Outpatient Medications Prior to Visit  Medication Sig Dispense Refill  . aspirin 81 MG EC tablet Take 1 tablet (81 mg total) by mouth daily. 30 tablet 2  . Blood Glucose Monitoring Suppl (TRUE METRIX METER) w/Device KIT Use as directed 1 kit 0  . Cyanocobalamin (VITAMIN B-12 PO) Take 1 tablet by mouth daily.    . gMarland Kitchenbapentin (NEURONTIN) 300 MG capsule Take 600 mg by mouth at bedtime.     . gMarland Kitchenucose blood (TRUE METRIX BLOOD GLUCOSE TEST) test strip Use as instructed 100 each 12  . guaiFENesin-dextromethorphan (ROBITUSSIN DM) 100-10 MG/5ML syrup Take 5 mLs by mouth every 4 (four) hours as needed  for cough.    . hydrOXYzine (ATARAX/VISTARIL) 25 MG tablet Take 1 tablet (25 mg total) by mouth at bedtime as needed. 30 tablet 1  . polyethylene glycol (MIRALAX / GLYCOLAX) packet Take 17 g by mouth 2 (two) times daily. 14 each 0  . traMADol (ULTRAM) 50 MG tablet Take 50 mg by mouth every 6 (six) hours as needed for moderate pain.    . TRUEPLUS LANCETS 28G MISC Use as directed 100 each 1  . venlafaxine XR (EFFEXOR-XR) 150 MG 24 hr capsule Take 150 mg by  mouth daily with breakfast.    . carvedilol (COREG) 6.25 MG tablet Take 1 tablet (6.25 mg total) by mouth 2 (two) times daily with a meal. 60 tablet 2  . furosemide (LASIX) 20 MG tablet Take 1 tablet (20 mg total) by mouth daily as needed (for fluid weight gain of 3-5 pounds or increased swelling). 30 tablet 1  . hydrALAZINE (APRESOLINE) 50 MG tablet Take 1 tablet (50 mg total) by mouth every 8 (eight) hours. 90 tablet 2  . spironolactone (ALDACTONE) 25 MG tablet Take 0.5 tablets (12.5 mg total) by mouth daily. 30 tablet 2   No facility-administered medications prior to visit.      Allergies:   Morphine and related   Social History   Social History  . Marital status: Single    Spouse name: N/A  . Number of children: N/A  . Years of education: N/A   Social History Main Topics  . Smoking status: Never Smoker  . Smokeless tobacco: Never Used  . Alcohol use No  . Drug use: No  . Sexual activity: Not Asked   Other Topics Concern  . None   Social History Narrative  . None     Family History:  The patient's family history includes Diabetes Mellitus II in her mother; Hypertension in her father and mother.   Review of Systems:   Please see the history of present illness.     General:  No chills, fever, or night sweats. Positive for weight changes.  Cardiovascular:  No chest pain, dyspnea on exertion, edema, orthopnea, palpitations, paroxysmal nocturnal dyspnea. Dermatological: No rash, lesions/masses Respiratory: No cough, dyspnea Urologic: No hematuria, dysuria Abdominal:   No nausea, vomiting, diarrhea, bright red blood per rectum, melena, or hematemesis Neurologic:  No visual changes, wkns, changes in mental status.  All other systems reviewed and are otherwise negative except as noted above.   Physical Exam:    VS:  BP 130/74   Pulse 80   Ht 4' 8"  (1.422 m)   Wt 169 lb 9.6 oz (76.9 kg)   BMI 38.02 kg/m    General: Well developed, well nourished [place] American  female appearing in no acute distress. Head: Normocephalic, atraumatic, sclera non-icteric, no xanthomas, nares are without discharge.  Neck: No carotid bruits. JVD not elevated.  Lungs: Respirations regular and unlabored, without wheezes or rales.  Heart: Regular rate and rhythm. No S3 or S4.  No murmur, no rubs, or gallops appreciated. Abdomen: Soft, non-tender, non-distended with normoactive bowel sounds. No hepatomegaly. No rebound/guarding. No obvious abdominal masses. Msk:  Strength and tone appear normal for age. No joint deformities or effusions. Extremities: No clubbing or cyanosis. Trace lower extremity edema.  Distal pedal pulses are 2+ bilaterally. Neuro: Alert and oriented X 3. Moves all extremities spontaneously. No focal deficits noted. Psych:  Responds to questions appropriately with a normal affect. Skin: No rashes or lesions noted  Wt Readings from Last 3  Encounters:  06/24/17 169 lb 9.6 oz (76.9 kg)  06/09/17 165 lb (74.8 kg)  06/02/17 160 lb 12.8 oz (72.9 kg)     Studies/Labs Reviewed:   EKG:  EKG is not ordered today.    Recent Labs: 05/27/2017: B Natriuretic Peptide 1,729.7; TSH 1.205 05/28/2017: Magnesium 1.8 06/09/2017: ALT 14; BUN 16; Creatinine, Ser 1.28; Hemoglobin 12.3; Platelets 431; Potassium 5.2; Sodium 138   Lipid Panel    Component Value Date/Time   CHOL 161 05/30/2017 0319   TRIG 133 05/30/2017 0319   HDL 42 05/30/2017 0319   CHOLHDL 3.8 05/30/2017 0319   VLDL 27 05/30/2017 0319   LDLCALC 92 05/30/2017 0319    Additional studies/ records that were reviewed today include:   Echocardiogram: 05/29/2017 Study Conclusions  - Left ventricle: LVEF is approximately 30 to 35% with severe   hypokinesis/akinesis of the inferior, anteroseptal, nferoseptal   walls; hypokinesis of the anterior wall. Doppler parameters are   consistent with abnormal left ventricular relaxation (grade 1   diastolic dysfunction). - Mitral valve: There was mild  regurgitation. - Right ventricle: The cavity size was mildly dilated. Systolic   function was mildly reduced.  Cardiac Catheterization: 06/01/2017 1. Angiographically normal coronary arteries 2. Normal LVEDP  Recommend: continued treatment of nonischemic cardiomyopathy  Assessment:    1. Chronic combined systolic and diastolic heart failure ([place])   2. NICM (nonischemic cardiomyopathy) ([place])   3. Essential hypertension   4. CKD (chronic kidney disease) stage 3, GFR 30-59 ml/min      Plan:   In order of problems listed above:  1. Chronic Combined Systolic and Diastolic CHF - recently admitted from 7/4 - 06/02/2017 for worsening dyspnea on exertion and at rest, found to have an EF of 30-35% with inferior, antero and septal HK. Cath showed normal coronary arteries, therefore making her presentation consistent with nonischemic cardiomyopathy.  - weight has been variable on her home scales but has trended up from 164 to 167 lbs. She denies any recent orthopnea, PND, or lower extremity edema. Does have trace edema on examination today.  - with stable BP, will continue Coreg, Spironolactone, and Lasix but add Losartan 23m daily to her medication regimen. Instructed her to take Lasix 242mfor the next 3 days in the setting of her edema and weight gain, then resume PRN dosing. Not a candidate for Entresto at this time as she does not have insurance coverage. At follow-up, consider further titration of her BB pending response to ARB therapy. Plan for repeat echo in 3 months to reassess EF ([place]   2. Nonischemic Cardiomyopathy - EF 30-35% with normal cors. Recheck EF in 3 months following medical therapy. If EF remains reduced, will need to consider referral to EP for ICD placement.   3. HTN - BP has been well-controlled per the patient's report. At 130/74 during today's visit. - continue Coreg 6.2532mID, Hydralazine 79m41mH,  and Spironolactone 12.5mg 82mly. Will add Losartan 25mg 79my.   4. Stage 3 CKD - creatinine peaked at 1.63 during her recent hospitalization. At 1.28 on 06/09/2017. - repeat BMET in 2 weeks following addition of ARB.   Medication Adjustments/Labs and Tests Ordered: Current medicines are reviewed at length with the patient today.  Concerns regarding medicines are outlined above.  Medication changes, Labs and Tests ordered today are listed in the Patient Instructions below. Patient Instructions  Start Losartan 25 mg daily  Take Lasix 20 mg daily for the next 3 days then 20 mg daily if needed  Continue all other medications  Schedule echocardiogram for 3 months from now.   Your physician recommends that you schedule a follow-up appointment after echo with Dr.Crenshaw     Signed, Erma Heritage, PA-C  06/24/2017 4:04 PM    [address], [place], [place]  [address] Phone: [phone]; Fax: [phone]  [address], [place], [place] [address] Phone: [phone]

## 2017-06-24 ENCOUNTER — Ambulatory Visit (INDEPENDENT_AMBULATORY_CARE_PROVIDER_SITE_OTHER): Payer: Self-pay | Admitting: Student

## 2017-06-24 ENCOUNTER — Encounter: Payer: Self-pay | Admitting: Student

## 2017-06-24 VITALS — BP 130/74 | HR 80 | Ht <= 58 in | Wt 169.6 lb

## 2017-06-24 DIAGNOSIS — I428 Other cardiomyopathies: Secondary | ICD-10-CM

## 2017-06-24 DIAGNOSIS — I10 Essential (primary) hypertension: Secondary | ICD-10-CM

## 2017-06-24 DIAGNOSIS — I5042 Chronic combined systolic (congestive) and diastolic (congestive) heart failure: Secondary | ICD-10-CM

## 2017-06-24 DIAGNOSIS — N183 Chronic kidney disease, stage 3 unspecified: Secondary | ICD-10-CM | POA: Insufficient documentation

## 2017-06-24 HISTORY — DX: Chronic combined systolic (congestive) and diastolic (congestive) heart failure: I50.42

## 2017-06-24 HISTORY — DX: Chronic kidney disease, stage 3 unspecified: N18.30

## 2017-06-24 MED ORDER — HYDRALAZINE HCL 50 MG PO TABS: 50.0000 mg | ORAL_TABLET | Freq: Three times a day (TID) | ORAL | 3 refills | Status: AC

## 2017-06-24 MED ORDER — LOSARTAN POTASSIUM 25 MG PO TABS: 25.0000 mg | ORAL_TABLET | Freq: Every day | ORAL | 6 refills | Status: AC

## 2017-06-24 MED ORDER — FUROSEMIDE 20 MG PO TABS: 20.0000 mg | ORAL_TABLET | Freq: Every day | ORAL | 6 refills | Status: AC | PRN

## 2017-06-24 MED ORDER — SPIRONOLACTONE 25 MG PO TABS: 12.5000 mg | ORAL_TABLET | Freq: Every day | ORAL | 6 refills | Status: AC

## 2017-06-24 MED ORDER — CARVEDILOL 6.25 MG PO TABS: 6.2500 mg | ORAL_TABLET | Freq: Two times a day (BID) | ORAL | 6 refills | Status: AC

## 2017-06-24 MED FILL — ?SPIRONOLACTONE 25 MG TABLE: 25 | 30 days supply | Qty: 15 | Fill #0

## 2017-06-24 MED FILL — CARVEDILOL 6.25 MG TABLET: 6.25 | 30 days supply | Qty: 60 | Fill #0

## 2017-06-24 MED FILL — LOSARTAN POTASSIUM 25 MG TA: 25 | 30 days supply | Qty: 30 | Fill #0

## 2017-06-24 MED FILL — FUROSEMIDE 20 MG TABLET: 20 | 30 days supply | Qty: 30 | Fill #0

## 2017-06-24 MED FILL — hydrALAZINE HCL 50 MG TABS: 50 | 30 days supply | Qty: 90 | Fill #0

## 2017-06-24 NOTE — Patient Instructions (Addendum)
Start Losartan 25 mg daily    Lasix 20 mg daily for the next 3 days then 20 mg daily if needed    Continue all other medications    Schedule echocardiogram in 3 months    Your physician recommends that you schedule a follow-up appointment after echo with Dr.Crenshaw

## 2017-07-22 ENCOUNTER — Other Ambulatory Visit (HOSPITAL_COMMUNITY): Payer: Self-pay

## 2017-07-28 ENCOUNTER — Other Ambulatory Visit: Payer: Self-pay | Admitting: Internal Medicine

## 2017-07-28 DIAGNOSIS — F419 Anxiety disorder, unspecified: Secondary | ICD-10-CM

## 2017-08-06 ENCOUNTER — Other Ambulatory Visit: Payer: Self-pay | Admitting: Internal Medicine

## 2017-08-24 ENCOUNTER — Other Ambulatory Visit: Payer: Self-pay | Admitting: Internal Medicine

## 2017-09-22 ENCOUNTER — Other Ambulatory Visit (HOSPITAL_COMMUNITY): Payer: Self-pay

## 2017-09-22 ENCOUNTER — Ambulatory Visit: Payer: Self-pay | Admitting: Cardiology

## 2017-09-22 NOTE — Progress Notes (Deleted)
HPI: Follow-up cardiomyopathy. Patient was admitted to [facility] in July 2018 for dyspnea. Echocardiogram showed ejection fraction 30-35%; grade 1 DD and mild MR; mild RVE. Cardiac catheterization revealed normal coronary arteries and normal LVEDP. TSH 1.05. Patient was diuresed and placed on medical therapy. Since last seen,   Current Outpatient Prescriptions  Medication Sig Dispense Refill  . aspirin 81 MG EC tablet Take 1 tablet (81 mg total) by mouth daily. 30 tablet 2  . Blood Glucose Monitoring Suppl (TRUE METRIX METER) w/Device KIT Use as directed 1 kit 0  . carvedilol (COREG) 6.25 MG tablet Take 1 tablet (6.25 mg total) by mouth 2 (two) times daily with a meal. 60 tablet 6  . Cyanocobalamin (VITAMIN B-12 PO) Take 1 tablet by mouth daily.    . furosemide (LASIX) 20 MG tablet Take 1 tablet (20 mg total) by mouth daily as needed (for fluid weight gain of 3-5 pounds or increased swelling). 30 tablet 6  . gabapentin (NEURONTIN) 300 MG capsule Take 600 mg by mouth at bedtime.     Marland Kitchen glucose blood (TRUE METRIX BLOOD GLUCOSE TEST) test strip Use as instructed 100 each 12  . guaiFENesin-dextromethorphan (ROBITUSSIN DM) 100-10 MG/5ML syrup Take 5 mLs by mouth every 4 (four) hours as needed for cough.    . hydrALAZINE (APRESOLINE) 50 MG tablet Take 1 tablet (50 mg total) by mouth every 8 (eight) hours. 90 tablet 3  . hydrOXYzine (ATARAX/VISTARIL) 25 MG tablet Take 1 tablet (25 mg total) by mouth at bedtime as needed. 30 tablet 1  . losartan (COZAAR) 25 MG tablet Take 1 tablet (25 mg total) by mouth daily. 30 tablet 6  . polyethylene glycol (MIRALAX / GLYCOLAX) packet Take 17 g by mouth 2 (two) times daily. 14 each 0  . spironolactone (ALDACTONE) 25 MG tablet Take 0.5 tablets (12.5 mg total) by mouth daily. 30 tablet 6  . traMADol (ULTRAM) 50 MG tablet Take 50 mg by mouth every 6 (six) hours as needed for moderate pain.    . TRUEPLUS LANCETS 28G MISC Use as directed 100 each 1  .  venlafaxine XR (EFFEXOR-XR) 150 MG 24 hr capsule Take 150 mg by mouth daily with breakfast.     No current facility-administered medications for this visit.      Past Medical History:  Diagnosis Date  . Acute combined systolic and diastolic heart failure ([place]) 06/01/2017  . Acute pulmonary edema ([place]) 05/27/2017  . Acute renal failure (ARF) ([place]) 05/27/2017  . Acute respiratory failure ([place]) 05/27/2017  . Acute respiratory failure with hypoxia ([place]) 05/27/2017  . Anxiety   . Anxiety disorder 06/09/2017  . Chronic combined systolic and diastolic heart failure ([place]) 06/24/2017  . CKD (chronic kidney disease) stage 3, GFR 30-59 ml/min ([facility]) 06/24/2017  . Essential hypertension 06/09/2017  . Fever 05/27/2017  . Fibromyalgia 06/09/2017  . Hyperglycemia 05/27/2017  . Hypertension   . Hypertensive urgency 05/27/2017  . NICM (nonischemic cardiomyopathy) ([place]) 06/09/2017  . Nonischemic cardiomyopathy ([place])    a. 05/2017: echo showing EF of 30-35% with inferior, antero, and septal HK. Cath with normal coronary arteries.    Past Surgical History:  Procedure Laterality Date  . LEFT HEART CATH AND CORONARY ANGIOGRAPHY N/A 06/01/2017   Procedure: Left Heart Cath and Coronary Angiography;  Surgeon: Sherren Mocha, MD;  Location: [facility];  Service: Cardiovascular;  Laterality: N/A;    Social History   Social History  . Marital status: Single  Spouse name: N/A  . Number of children: N/A  . Years of education: N/A   Occupational History  . Not on file.   Social History Main Topics  . Smoking status: Never Smoker  . Smokeless tobacco: Never Used  . Alcohol use No  . Drug use: No  . Sexual activity: Not on file   Other Topics Concern  . Not on file   Social History Narrative  . No narrative on file    Family History  Problem Relation Age of Onset  . Diabetes Mellitus II Mother   . Hypertension Mother   . Hypertension Father     ROS: no fevers or chills, productive cough, hemoptysis,  dysphasia, odynophagia, melena, hematochezia, dysuria, hematuria, rash, seizure activity, orthopnea, PND, pedal edema, claudication. Remaining systems are negative.  Physical Exam: Well-developed well-nourished in no acute distress.  Skin is warm and dry.  HEENT is normal.  Neck is supple.  Chest is clear to auscultation with normal expansion.  Cardiovascular exam is regular rate and rhythm.  Abdominal exam nontender or distended. No masses palpated. Extremities show no edema. neuro grossly intact  ECG- personally reviewed  A/P  1  Kirk Ruths, MD

## 2017-10-05 ENCOUNTER — Ambulatory Visit: Payer: Self-pay | Admitting: Cardiology

## 2017-10-22 ENCOUNTER — Other Ambulatory Visit (HOSPITAL_COMMUNITY): Payer: Self-pay

## 2018-05-29 IMAGING — CT CT HEAD W/O CM
3 series · 15 of 45 positions shown, 18 images · non-contrast
Comparison: None.

CLINICAL DATA: Fall the

EXAM:
CT HEAD WITHOUT CONTRAST
TECHNIQUE: Contiguous axial images were obtained from the base of the skull
through the vertex without intravenous contrast.

[Series 3: head 5.0 h30s · axial · 0.39mm/px · z∈[-58,+57]mm · 9 of 28 slices shown, 12 images]
[im 3/28  brain]
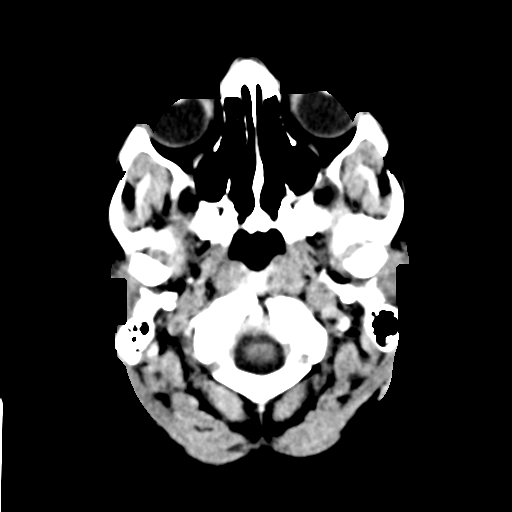
[im 3/28  bone]
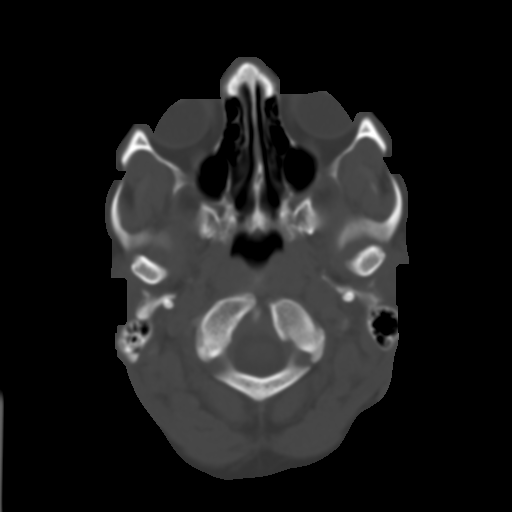
[im 6/28  brain]
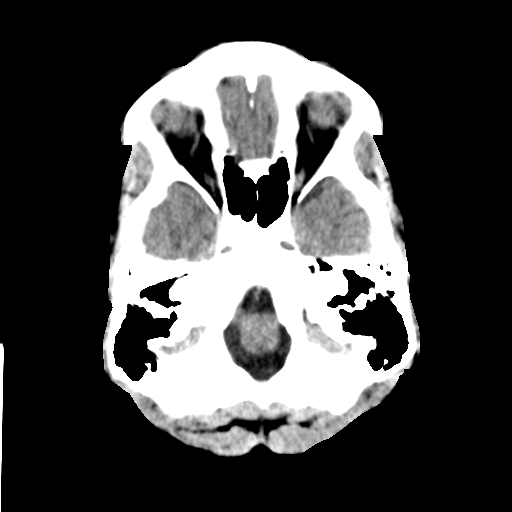
[im 9/28  brain]
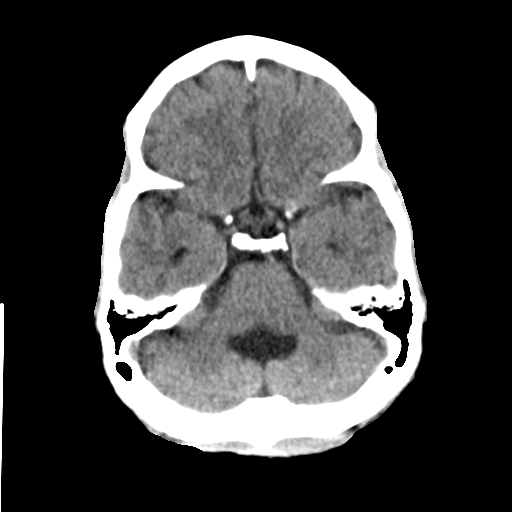
[im 12/28  brain]
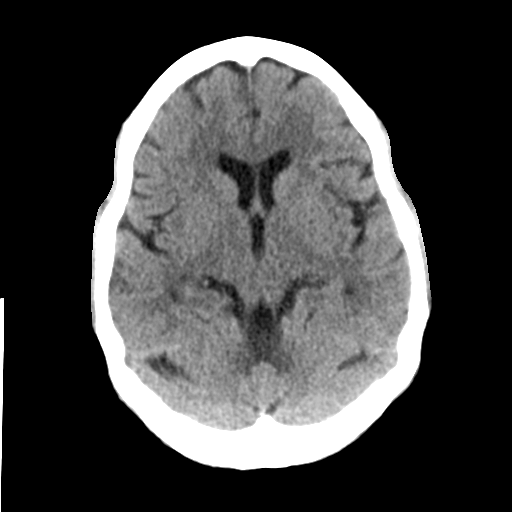
[im 15/28  brain]
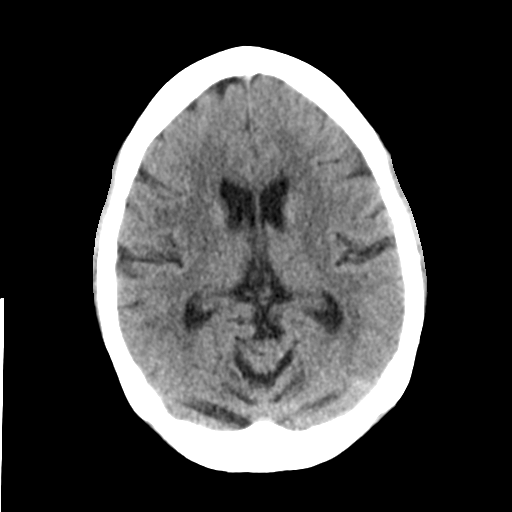
[im 15/28  bone]
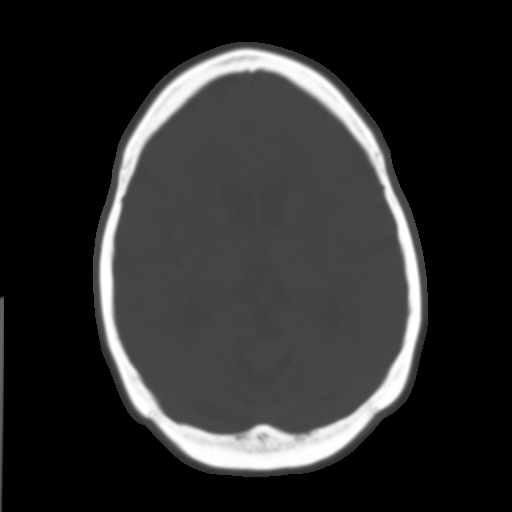
[im 17/28  brain]
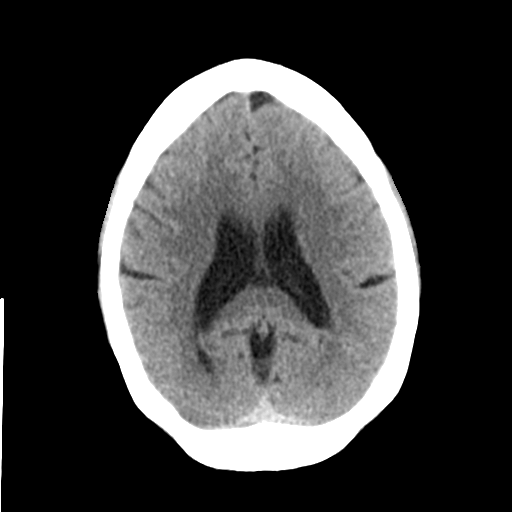
[im 20/28  brain]
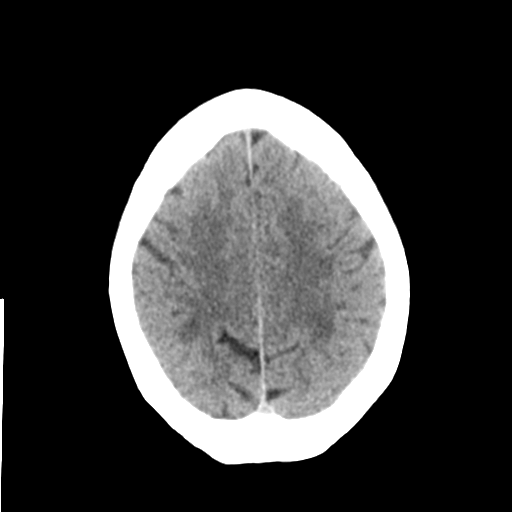
[im 23/28  brain]
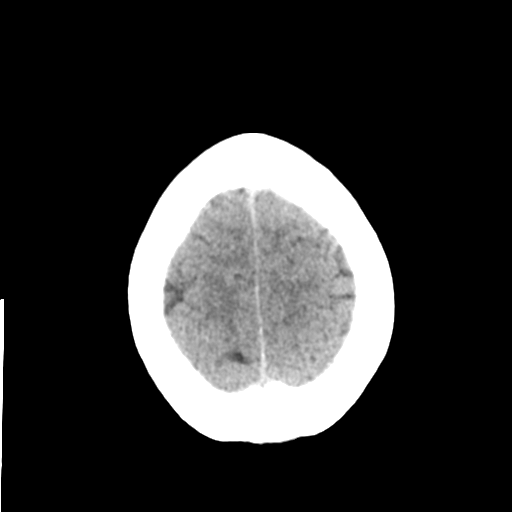
[im 26/28  brain]
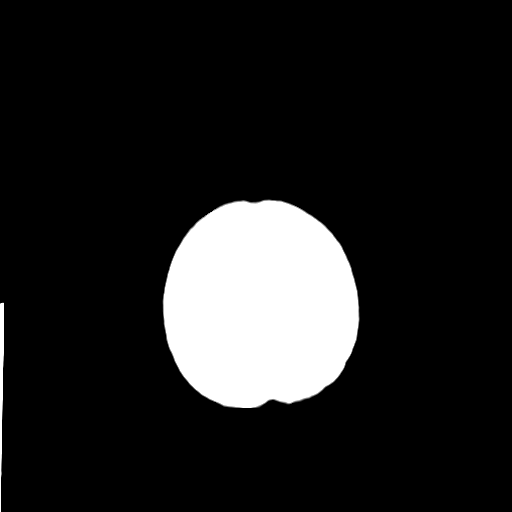
[im 26/28  bone]
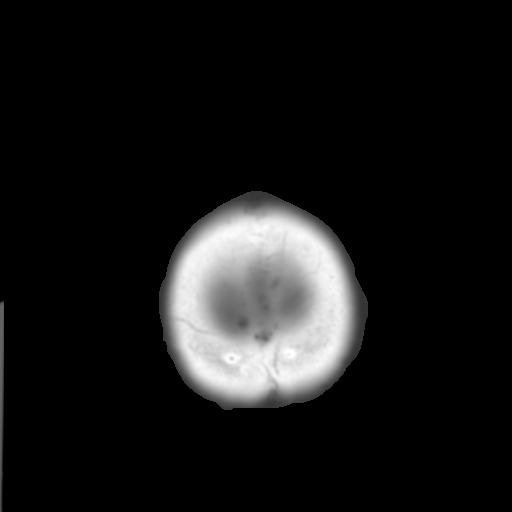

[Series 5: head 3.0 mpr cor · coronal · 0.29mm/px · 3 of 67 slices shown]
[im 23/67  brain]
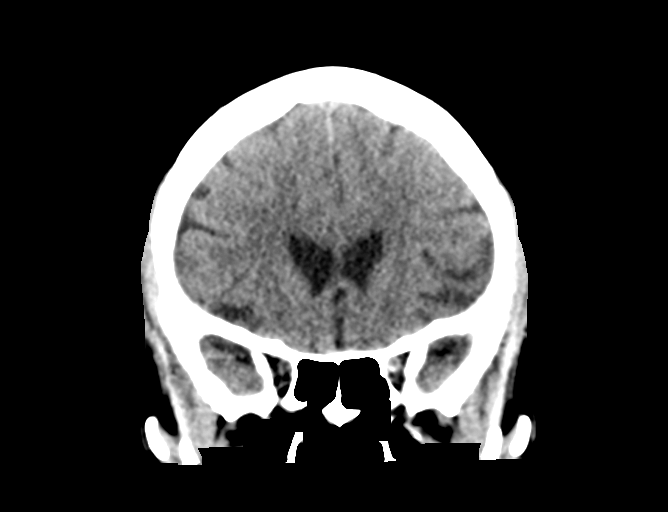
[im 30/67  brain]
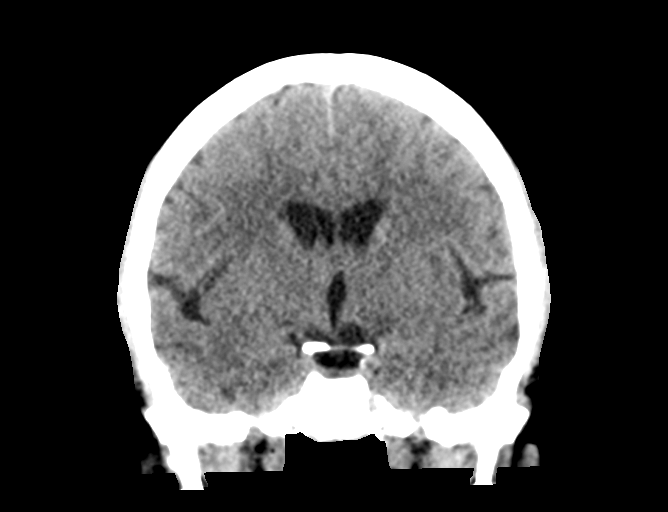
[im 37/67  brain]
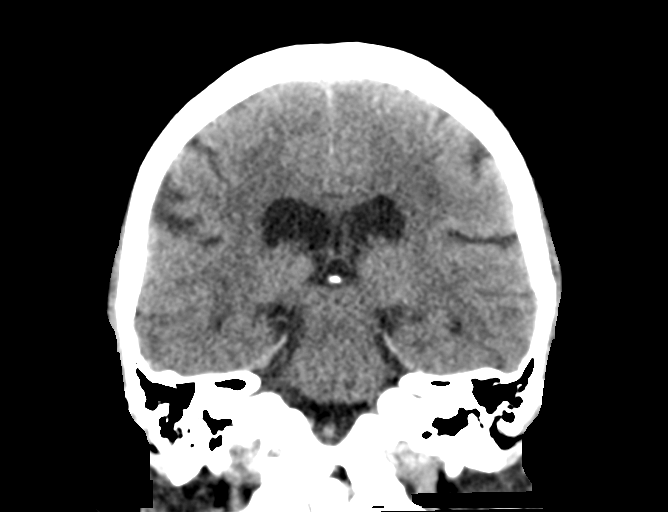

[Series 6: head 3.0 mpr sag · sagittal · 0.29mm/px · 3 of 67 slices shown]
[im 23/67  brain]
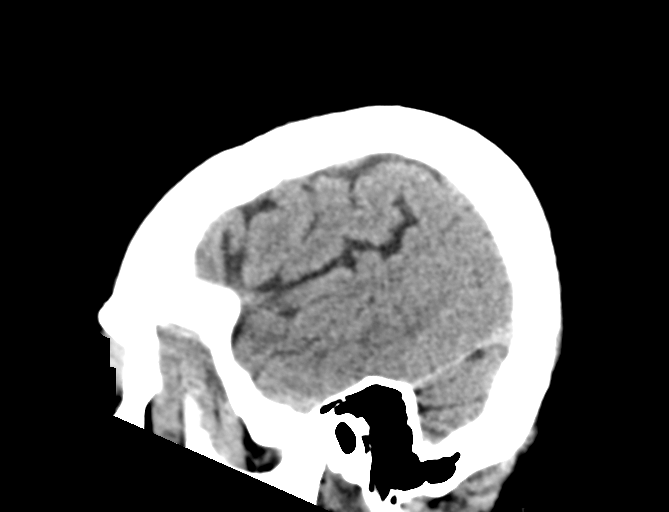
[im 34/67  brain]
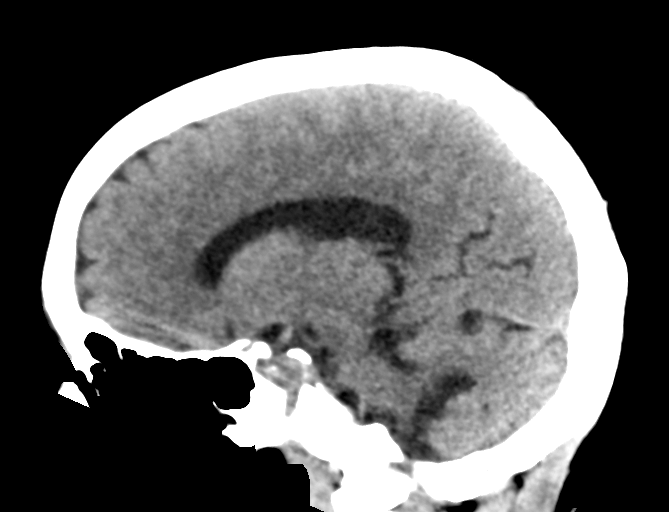
[im 45/67  brain]
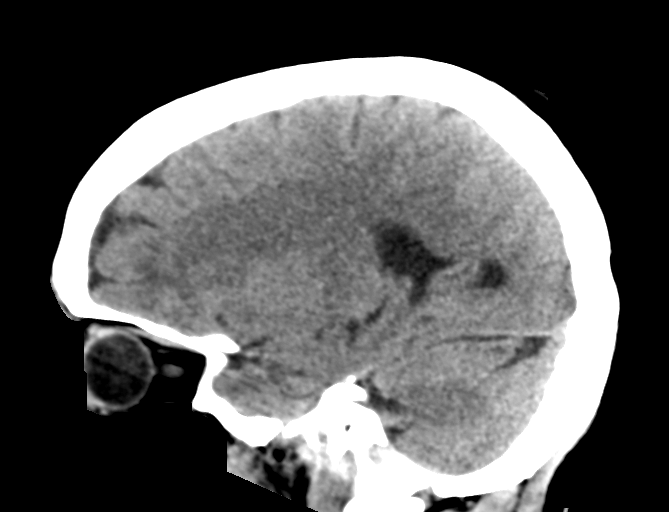

[15 of 45 positions shown; findings below may reference images not displayed]

FINDINGS: Brain: No mass lesion, intraparenchymal hemorrhage or extra-axial
collection. No evidence of acute cortical infarct. Brain parenchyma
and CSF-containing spaces are normal for age.

Vascular: No hyperdense vessel or unexpected calcification.

Skull: Normal visualized skull base, calvarium and extracranial soft
tissues.

Sinuses/Orbits: No sinus fluid levels or advanced mucosal
thickening. No mastoid effusion. Normal orbits.
IMPRESSION: Normal head CT.
